# Patient Record
Sex: Male | Born: 1960
Health system: Southern US, Community
[De-identification: ages and names within clinical notes are randomized; demographics above are authoritative.]

## PROBLEM LIST (undated history)

## (undated) DIAGNOSIS — I10 Essential (primary) hypertension: Secondary | ICD-10-CM

## (undated) DIAGNOSIS — F909 Attention-deficit hyperactivity disorder, unspecified type: Secondary | ICD-10-CM

## (undated) DIAGNOSIS — E039 Hypothyroidism, unspecified: Secondary | ICD-10-CM

## (undated) DIAGNOSIS — E785 Hyperlipidemia, unspecified: Secondary | ICD-10-CM

## (undated) HISTORY — PX: WISDOM TOOTH EXTRACTION: SHX21

## (undated) HISTORY — DX: Hyperlipidemia, unspecified: E78.5

## (undated) HISTORY — DX: Attention-deficit hyperactivity disorder, unspecified type: F90.9

## (undated) HISTORY — DX: Hypothyroidism, unspecified: E03.9

## (undated) HISTORY — PX: PRE-MALIGNANT / BENIGN SKIN LESION EXCISION: SHX160

## (undated) HISTORY — DX: Essential (primary) hypertension: I10

---

## 2005-09-22 ENCOUNTER — Ambulatory Visit: Payer: Self-pay | Admitting: Family Medicine

## 2008-02-13 ENCOUNTER — Ambulatory Visit: Payer: Self-pay | Admitting: Family Medicine

## 2008-02-13 LAB — CONVERTED CEMR LAB
AST: 25 units/L (ref 0–37)
Basophils Relative: 0.2 % (ref 0.0–1.0)
Bilirubin, Direct: 0.2 mg/dL (ref 0.0–0.3)
CO2: 31 meq/L (ref 19–32)
Chloride: 102 meq/L (ref 96–112)
Eosinophils Absolute: 0.2 10*3/uL (ref 0.0–0.6)
Eosinophils Relative: 3.9 % (ref 0.0–5.0)
GFR calc non Af Amer: 77 mL/min
Glucose, Bld: 100 mg/dL — ABNORMAL HIGH (ref 70–99)
HCT: 42.8 % (ref 39.0–52.0)
Lymphocytes Relative: 34.4 % (ref 12.0–46.0)
MCV: 85.4 fL (ref 78.0–100.0)
Neutrophils Relative %: 50.7 % (ref 43.0–77.0)
Nitrite: NEGATIVE
Protein, U semiquant: NEGATIVE
RBC: 5.01 M/uL (ref 4.22–5.81)
Sodium: 140 meq/L (ref 135–145)
Specific Gravity, Urine: 1.01
TSH: 7.21 microintl units/mL — ABNORMAL HIGH (ref 0.35–5.50)
Total Bilirubin: 1.3 mg/dL — ABNORMAL HIGH (ref 0.3–1.2)
Total Protein: 7.2 g/dL (ref 6.0–8.3)
WBC: 6 10*3/uL (ref 4.5–10.5)

## 2008-03-17 ENCOUNTER — Ambulatory Visit: Payer: Self-pay | Admitting: Family Medicine

## 2008-03-17 DIAGNOSIS — E039 Hypothyroidism, unspecified: Secondary | ICD-10-CM | POA: Insufficient documentation

## 2008-03-17 DIAGNOSIS — I10 Essential (primary) hypertension: Secondary | ICD-10-CM | POA: Insufficient documentation

## 2008-03-17 LAB — CONVERTED CEMR LAB: TSH: 7.89 microintl units/mL — ABNORMAL HIGH (ref 0.35–5.50)

## 2008-03-19 ENCOUNTER — Telehealth: Payer: Self-pay | Admitting: Family Medicine

## 2008-06-30 ENCOUNTER — Encounter: Payer: Self-pay | Admitting: Family Medicine

## 2008-12-04 HISTORY — PX: COLONOSCOPY: SHX174

## 2009-07-22 ENCOUNTER — Telehealth: Payer: Self-pay | Admitting: Family Medicine

## 2009-07-30 ENCOUNTER — Ambulatory Visit: Payer: Self-pay | Admitting: Family Medicine

## 2009-08-25 ENCOUNTER — Ambulatory Visit: Payer: Self-pay | Admitting: Family Medicine

## 2009-08-25 LAB — CONVERTED CEMR LAB
Blood in Urine, dipstick: NEGATIVE
Nitrite: NEGATIVE
Protein, U semiquant: NEGATIVE
Urobilinogen, UA: 0.2
WBC Urine, dipstick: NEGATIVE

## 2009-08-27 ENCOUNTER — Telehealth: Payer: Self-pay | Admitting: Family Medicine

## 2009-08-27 LAB — CONVERTED CEMR LAB
ALT: 33 units/L (ref 0–53)
AST: 24 units/L (ref 0–37)
Basophils Relative: 0.2 % (ref 0.0–3.0)
Bilirubin, Direct: 0.1 mg/dL (ref 0.0–0.3)
Calcium: 9.7 mg/dL (ref 8.4–10.5)
Direct LDL: 134.9 mg/dL
Eosinophils Relative: 3.5 % (ref 0.0–5.0)
HCT: 44.1 % (ref 39.0–52.0)
HDL: 44.2 mg/dL (ref >39.00)
Lymphs Abs: 1.9 10*3/uL (ref 0.7–4.0)
MCV: 88.2 fL (ref 78.0–100.0)
Monocytes Absolute: 0.7 10*3/uL (ref 0.1–1.0)
Monocytes Relative: 10.6 % (ref 3.0–12.0)
Potassium: 5.2 meq/L — ABNORMAL HIGH (ref 3.5–5.1)
RBC: 5 M/uL (ref 4.22–5.81)
Sodium: 140 meq/L (ref 135–145)
TSH: 5.61 microintl units/mL — ABNORMAL HIGH (ref 0.35–5.50)
Total Bilirubin: 1.8 mg/dL — ABNORMAL HIGH (ref 0.3–1.2)
Total CHOL/HDL Ratio: 5
Total Protein: 7.6 g/dL (ref 6.0–8.3)
Triglycerides: 134 mg/dL (ref 0.0–149.0)
VLDL: 26.8 mg/dL (ref 0.0–40.0)
WBC: 6.8 10*3/uL (ref 4.5–10.5)

## 2009-09-27 ENCOUNTER — Ambulatory Visit: Payer: Self-pay | Admitting: Internal Medicine

## 2009-09-27 DIAGNOSIS — K625 Hemorrhage of anus and rectum: Secondary | ICD-10-CM | POA: Insufficient documentation

## 2009-09-27 DIAGNOSIS — K219 Gastro-esophageal reflux disease without esophagitis: Secondary | ICD-10-CM | POA: Insufficient documentation

## 2009-10-07 ENCOUNTER — Ambulatory Visit: Payer: Self-pay | Admitting: Internal Medicine

## 2009-10-07 LAB — HM COLONOSCOPY

## 2011-03-31 ENCOUNTER — Other Ambulatory Visit: Payer: Self-pay | Admitting: Family Medicine

## 2011-03-31 NOTE — Telephone Encounter (Signed)
Time for an office visit 

## 2011-05-03 ENCOUNTER — Other Ambulatory Visit: Payer: Self-pay | Admitting: *Deleted

## 2011-05-03 MED ORDER — LEVOTHYROXINE SODIUM 75 MCG PO TABS: 75.0000 ug | ORAL_TABLET | Freq: Every day | ORAL | Status: DC

## 2011-05-03 MED ORDER — LISINOPRIL 40 MG PO TABS: 40.0000 mg | ORAL_TABLET | Freq: Every day | ORAL | Status: DC

## 2011-05-03 NOTE — Telephone Encounter (Signed)
Pt has appt scheduled to see Dr. Tawanna Cooler on 05/08/11, however will need refill of synthroid 75 mcg and lisinopril 40 mg sent to [facility] until he can be seen on Monday.

## 2011-05-05 ENCOUNTER — Encounter: Payer: Self-pay | Admitting: Family Medicine

## 2011-05-08 ENCOUNTER — Encounter: Payer: Self-pay | Admitting: Family Medicine

## 2011-05-08 ENCOUNTER — Ambulatory Visit (INDEPENDENT_AMBULATORY_CARE_PROVIDER_SITE_OTHER): Payer: BC Managed Care – PPO | Admitting: Family Medicine

## 2011-05-08 DIAGNOSIS — R5383 Other fatigue: Secondary | ICD-10-CM

## 2011-05-08 DIAGNOSIS — I10 Essential (primary) hypertension: Secondary | ICD-10-CM

## 2011-05-08 DIAGNOSIS — E039 Hypothyroidism, unspecified: Secondary | ICD-10-CM

## 2011-05-08 DIAGNOSIS — R5381 Other malaise: Secondary | ICD-10-CM

## 2011-05-08 LAB — HEPATIC FUNCTION PANEL
Alkaline Phosphatase: 58 U/L (ref 39–117)
Bilirubin, Direct: 0.1 mg/dL (ref 0.0–0.3)
Total Bilirubin: 1 mg/dL (ref 0.3–1.2)

## 2011-05-08 LAB — BASIC METABOLIC PANEL
Calcium: 9.3 mg/dL (ref 8.4–10.5)
Creatinine, Ser: 1.1 mg/dL (ref 0.4–1.5)
GFR: 76.36 mL/min (ref >60.00)
Sodium: 135 mEq/L (ref 135–145)

## 2011-05-08 LAB — POCT URINALYSIS DIPSTICK
Bilirubin, UA: NEGATIVE
Blood, UA: NEGATIVE
Leukocytes, UA: NEGATIVE
Nitrite, UA: NEGATIVE
Protein, UA: NEGATIVE
pH, UA: 5.5

## 2011-05-08 LAB — CBC WITH DIFFERENTIAL/PLATELET
Basophils Absolute: 0 10*3/uL (ref 0.0–0.1)
Eosinophils Absolute: 0.1 10*3/uL (ref 0.0–0.7)
HCT: 41.4 % (ref 39.0–52.0)
Lymphs Abs: 1.7 10*3/uL (ref 0.7–4.0)
MCHC: 34.5 g/dL (ref 30.0–36.0)
Monocytes Absolute: 0.6 10*3/uL (ref 0.1–1.0)
Monocytes Relative: 8.5 % (ref 3.0–12.0)
Neutro Abs: 5 10*3/uL (ref 1.4–7.7)
Platelets: 228 10*3/uL (ref 150.0–400.0)
RDW: 12.8 % (ref 11.5–14.6)

## 2011-05-08 LAB — LIPID PANEL
HDL: 49.8 mg/dL (ref >39.00)
Total CHOL/HDL Ratio: 3
Triglycerides: 101 mg/dL (ref 0.0–149.0)
VLDL: 20.2 mg/dL (ref 0.0–40.0)

## 2011-05-08 MED ORDER — LISINOPRIL 40 MG PO TABS: 40.0000 mg | ORAL_TABLET | Freq: Every day | ORAL | Status: DC

## 2011-05-08 MED ORDER — LEVOTHYROXINE SODIUM 75 MCG PO TABS: 75.0000 ug | ORAL_TABLET | Freq: Every day | ORAL | Status: DC

## 2011-05-08 NOTE — Patient Instructions (Addendum)
Decrease the lisinopril to 30 mg daily.  Check y  blood pressure daily in the morning and if after one month ur  still having symptoms of lightheadedness when you stand up.,,,,,,,,,,,,,,,,,,,,,  Decrease the dose to 20 mg daily.  Continue thyroid medication, and aspirin.  Follow-up in one year annual physical sooner if any problems  I will also get an appointment to see Dr. Mellody Dance for evaluation of possible sleep apnea

## 2011-05-08 NOTE — Progress Notes (Signed)
  Subjective:    Patient ID: William Rosales, male    DOB: 23-Jul-1961, 50 y.o.   MRN: [id number]  HPI          William Rosales is a 50 year old, married male, nonsmoker, who comes in today for general physical examination because of a history of hypertension and hypothyroidism.  Four hypertension.  He takes lisinopril 40 mg daily.  He, states he's lightheaded when he gets up suddenly.  He also now has started going back to the gym and is exercising on a regular basis.  We discussed a methodology of lowering his medication.  Monthly.  He also has a history of hypothyroidism on Synthroid 75 mcg daily.  Will check TSH level today.  Tetanus 2010 annual eye exam by ophthalmologist.  He has fatigue, even though he sleeps for long periods of time.  Question sleep apnea    Review of Systems  Constitutional: Positive for fatigue.  HENT: Negative.   Eyes: Negative.   Respiratory: Negative.   Cardiovascular: Negative.   Gastrointestinal: Negative.   Genitourinary: Negative.   Musculoskeletal: Negative.   Skin: Negative.   Neurological: Negative.   Hematological: Negative.   Psychiatric/Behavioral: Negative.        Objective:   Physical Exam  Constitutional: He is oriented to person, place, and time. He appears well-developed and well-nourished.  HENT:  Head: Normocephalic and atraumatic.  Right Ear: External ear normal.  Left Ear: External ear normal.  Nose: Nose normal.  Mouth/Throat: Oropharynx is clear and moist.  Eyes: Conjunctivae and EOM are normal. Pupils are equal, round, and reactive to light.  Neck: Normal range of motion. Neck supple. No JVD present. No tracheal deviation present. No thyromegaly present.  Cardiovascular: Normal rate, regular rhythm, normal heart sounds and intact distal pulses.  Exam reveals no gallop and no friction rub.   No murmur heard. Pulmonary/Chest: Effort normal and breath sounds normal. No stridor. No respiratory distress. He has no wheezes. He has  no rales. He exhibits no tenderness.  Abdominal: Soft. Bowel sounds are normal. He exhibits no distension and no mass. There is no tenderness. There is no rebound and no guarding.  Genitourinary: Rectum normal, prostate normal and penis normal. Guaiac negative stool. No penile tenderness.  Musculoskeletal: Normal range of motion. He exhibits no edema and no tenderness.  Lymphadenopathy:    He has no cervical adenopathy.  Neurological: He is alert and oriented to person, place, and time. He has normal reflexes. No cranial nerve deficit. He exhibits normal muscle tone.  Skin: Skin is warm and dry. No rash noted. No erythema. No pallor.  Psychiatric: He has a normal mood and affect. His behavior is normal. Judgment and thought content normal.          Assessment & Plan:  Hypertension decrease lisinopril to 30 daily because of symptoms of orthostatic hypotension.  Continue Synthroid 75 mcg daily, and a baby aspirin.  Sleep study of [facility] and by Dr. Mellody Dance

## 2011-05-10 NOTE — Progress Notes (Signed)
Pt aware and copy ready for pick up

## 2011-05-31 ENCOUNTER — Ambulatory Visit (INDEPENDENT_AMBULATORY_CARE_PROVIDER_SITE_OTHER): Payer: BC Managed Care – PPO | Admitting: Pulmonary Disease

## 2011-05-31 ENCOUNTER — Encounter: Payer: Self-pay | Admitting: Pulmonary Disease

## 2011-05-31 VITALS — BP 112/80 | HR 63 | Temp 97.8°F | Ht 71.0 in | Wt 245.6 lb

## 2011-05-31 DIAGNOSIS — G4730 Sleep apnea, unspecified: Secondary | ICD-10-CM

## 2011-05-31 DIAGNOSIS — G4710 Hypersomnia, unspecified: Secondary | ICD-10-CM

## 2011-05-31 NOTE — Patient Instructions (Signed)
Will set up for home sleep testing.  Will call once results available Work on weight loss.

## 2011-05-31 NOTE — Assessment & Plan Note (Signed)
The pt's history is very suggestive of some degree of sleep disordered breathing.  He has been noted to have loud snoring, abnl breathing pattern, nonrestorative sleep, and EDS at times.  I have had a long discussion with the pt about sleep apnea, including its impact on QOL and CV health.  I think there is enough suspicion to warrant a sleep study, and the pt agrees (his wife is worried about this).

## 2011-05-31 NOTE — Progress Notes (Signed)
  Subjective:    Patient ID: William Rosales, male    DOB: Oct 08, 1961, 50 y.o.   MRN: [id number]  HPI The pt is a 50y/o male who I have been asked to see for possible osa.  His history is significant for: -loud snoring during sleep with abnormal breathing pattern -nonrestorative sleep -definite sleep pressure during the day with periods of inactivity, and occasionally catnaps at his desk -Can doze with routine tv in evenings, and some sleep pressure with driving longer distances. -weight down from 2 yrs ago, primarily in the last 5mos.  -epworth score 9.  Sleep Questionnaire: What time do you typically go to bed?( Between what hours) 10pm How long does it take you to fall asleep? less than How many times during the night do you wake up? 1 What time do you get out of bed to start your day? 0500 Do you drive or operate heavy machinery in your occupation? Yes How much has your weight changed (up or down) over the past two years? (In pounds) 25 lb (11.34 kg) Have you ever had a sleep study before? No Do you currently use CPAP? No Do you wear oxygen at any time? No    Review of Systems  Constitutional: Negative for fever and unexpected weight change.  HENT: Negative for ear pain, nosebleeds, congestion, sore throat, rhinorrhea, sneezing, trouble swallowing, dental problem, postnasal drip and sinus pressure.   Eyes: Negative for redness and itching.  Respiratory: Positive for shortness of breath. Negative for cough, chest tightness and wheezing.   Cardiovascular: Negative for palpitations and leg swelling.  Gastrointestinal: Negative for nausea and vomiting.  Genitourinary: Negative for dysuria.  Musculoskeletal: Negative for joint swelling.  Skin: Negative for rash.  Neurological: Negative for headaches.  Hematological: Does not bruise/bleed easily.  Psychiatric/Behavioral: Negative for dysphoric mood. The patient is not nervous/anxious.        Objective:   Physical  Exam Constitutional:  obese, no acute distress  HENT:  Nares patent without discharge, mild turbinate hypertrophy and mild septal deviation.   Oropharynx without exudate, palate and uvula are moderately elongated.  Eyes:  Perrla, eomi, no scleral icterus  Neck:  No JVD, no TMG  Cardiovascular:  Normal rate, regular rhythm, no rubs or gallops.  No murmurs        Intact distal pulses  Pulmonary :  Normal breath sounds, no stridor or respiratory distress   No rales, rhonchi, or wheezing  Abdominal:  Soft, nondistended, bowel sounds present.  No tenderness noted.   Musculoskeletal:  No lower extremity edema noted.  Lymph Nodes:  No cervical lymphadenopathy noted  Skin:  No cyanosis noted  Neurologic:  Alert, appropriate, moves all 4 extremities without obvious deficit.         Assessment & Plan:

## 2011-06-02 ENCOUNTER — Telehealth: Payer: Self-pay | Admitting: Pulmonary Disease

## 2011-06-02 NOTE — Telephone Encounter (Signed)
Please let pt know that I think he is an excellent candidate for home sleep testing, but his insurance company will only allow the formal sleep study at lab.  If he wishes to pursue a workup for this, will need study at sleep center.

## 2011-06-06 NOTE — Telephone Encounter (Signed)
LMOAM for patient to return my call in reference to home sleep study.

## 2011-06-12 NOTE — Telephone Encounter (Signed)
Spoke with patient today and advised patient that [employer] denied home sleep study. Patient stated that he wanted to call [employer] and see what his benefits on a formal study would be. Gave patient procedure code and patient will return my call and let me know what he has decided.

## 2011-06-13 NOTE — Telephone Encounter (Signed)
Pt to return my call if he wishes to schedule formal sleep study.

## 2012-05-18 ENCOUNTER — Other Ambulatory Visit: Payer: Self-pay | Admitting: Family Medicine

## 2013-03-07 ENCOUNTER — Other Ambulatory Visit: Payer: Self-pay | Admitting: Family Medicine

## 2013-04-23 ENCOUNTER — Other Ambulatory Visit: Payer: Self-pay | Admitting: Family Medicine

## 2013-05-30 ENCOUNTER — Other Ambulatory Visit: Payer: Self-pay | Admitting: Family Medicine

## 2013-05-30 NOTE — Telephone Encounter (Signed)
Pt has cpx sch for 07-29-13 and labs 07-22-13. Pt is out of synthroid med

## 2013-06-30 ENCOUNTER — Other Ambulatory Visit: Payer: Self-pay | Admitting: Family Medicine

## 2013-07-22 ENCOUNTER — Other Ambulatory Visit: Payer: BC Managed Care – PPO

## 2013-07-29 ENCOUNTER — Encounter: Payer: BC Managed Care – PPO | Admitting: Family Medicine

## 2013-08-07 ENCOUNTER — Other Ambulatory Visit: Payer: Self-pay | Admitting: Family Medicine

## 2013-09-09 ENCOUNTER — Other Ambulatory Visit (INDEPENDENT_AMBULATORY_CARE_PROVIDER_SITE_OTHER): Payer: BC Managed Care – PPO

## 2013-09-09 DIAGNOSIS — Z0000 Encounter for general adult medical examination without abnormal findings: Secondary | ICD-10-CM

## 2013-09-09 LAB — LIPID PANEL
HDL: 53.5 mg/dL (ref >39.00)
Total CHOL/HDL Ratio: 4
VLDL: 12.6 mg/dL (ref 0.0–40.0)

## 2013-09-09 LAB — CBC WITH DIFFERENTIAL/PLATELET
Basophils Relative: 0.3 % (ref 0.0–3.0)
Eosinophils Relative: 2.7 % (ref 0.0–5.0)
HCT: 42.5 % (ref 39.0–52.0)
Lymphs Abs: 2 10*3/uL (ref 0.7–4.0)
MCV: 85.7 fl (ref 78.0–100.0)
Monocytes Absolute: 0.6 10*3/uL (ref 0.1–1.0)
Monocytes Relative: 10.1 % (ref 3.0–12.0)
Platelets: 199 10*3/uL (ref 150.0–400.0)
RBC: 4.96 Mil/uL (ref 4.22–5.81)
WBC: 5.9 10*3/uL (ref 4.5–10.5)

## 2013-09-09 LAB — BASIC METABOLIC PANEL
BUN: 23 mg/dL (ref 6–23)
CO2: 29 mEq/L (ref 19–32)
Calcium: 9.3 mg/dL (ref 8.4–10.5)
Glucose, Bld: 106 mg/dL — ABNORMAL HIGH (ref 70–99)
Potassium: 4.8 mEq/L (ref 3.5–5.1)
Sodium: 140 mEq/L (ref 135–145)

## 2013-09-09 LAB — POCT URINALYSIS DIPSTICK
Leukocytes, UA: NEGATIVE
Nitrite, UA: NEGATIVE
Protein, UA: NEGATIVE
pH, UA: 5

## 2013-09-09 LAB — HEPATIC FUNCTION PANEL
ALT: 23 U/L (ref 0–53)
Albumin: 4.1 g/dL (ref 3.5–5.2)
Total Protein: 6.7 g/dL (ref 6.0–8.3)

## 2013-09-09 LAB — PSA: PSA: 0.8 ng/mL (ref 0.10–4.00)

## 2013-09-12 ENCOUNTER — Other Ambulatory Visit: Payer: Self-pay | Admitting: Family Medicine

## 2013-09-16 ENCOUNTER — Encounter: Payer: Self-pay | Admitting: Family Medicine

## 2013-09-16 ENCOUNTER — Ambulatory Visit (INDEPENDENT_AMBULATORY_CARE_PROVIDER_SITE_OTHER): Payer: BC Managed Care – PPO | Admitting: Family Medicine

## 2013-09-16 VITALS — BP 130/80 | Temp 98.6°F | Ht 71.0 in | Wt 229.0 lb

## 2013-09-16 DIAGNOSIS — I10 Essential (primary) hypertension: Secondary | ICD-10-CM

## 2013-09-16 DIAGNOSIS — M722 Plantar fascial fibromatosis: Secondary | ICD-10-CM

## 2013-09-16 DIAGNOSIS — E039 Hypothyroidism, unspecified: Secondary | ICD-10-CM

## 2013-09-16 DIAGNOSIS — Z23 Encounter for immunization: Secondary | ICD-10-CM

## 2013-09-16 MED ORDER — LEVOTHYROXINE SODIUM 75 MCG PO TABS: ORAL_TABLET | ORAL | Status: DC

## 2013-09-16 MED ORDER — LISINOPRIL 40 MG PO TABS: ORAL_TABLET | ORAL | Status: DC

## 2013-09-16 NOTE — Patient Instructions (Signed)
For the plantar fasciitis we will get you a consult with Jeanene Erb physical therapist  For the soreness in your left breast splint, ice, Motrin 600 mg twice daily  Continue your other medication  Return in one year for general physical examination sooner if any problem

## 2013-09-16 NOTE — Progress Notes (Signed)
  Subjective:    Patient ID: William Rosales, male    DOB: 1961-07-21, 52 y.o.   MRN: [id number]  HPI William Rosales is a 52 year old married male nonsmoker who comes in today for a physical examination because of a history of hypothyroidism and hypertension  He takes is Synthroid 75 mcg daily TSH normal 3. A5.  He takes lisinopril 40 mg daily BP here 130/80. He says at home it runs around 120/60-70 diastolic and occasionally feels lightheaded when he stands up. We discussed decreasing his medication however stop bothering him that much and he elects to continue his current dose  He gets routine eye care, dental care, colonoscopy couple years ago because of a family history of colon cancer was negative. Tetanus 2010 seasonal flu shot today  Review of systems otherwise negative except for some soreness in his left wrist and his left heel he does go to the gym very frequently   Review of Systems  Constitutional: Negative.   HENT: Negative.   Eyes: Negative.   Respiratory: Negative.   Cardiovascular: Negative.   Gastrointestinal: Negative.   Genitourinary: Negative.   Musculoskeletal: Negative.   Skin: Negative.   Neurological: Negative.   Psychiatric/Behavioral: Negative.        Objective:   Physical Exam  Nursing note and vitals reviewed. Constitutional: He is oriented to person, place, and time. He appears well-developed and well-nourished.  HENT:  Head: Normocephalic and atraumatic.  Right Ear: External ear normal.  Left Ear: External ear normal.  Nose: Nose normal.  Mouth/Throat: Oropharynx is clear and moist.  Eyes: Conjunctivae and EOM are normal. Pupils are equal, round, and reactive to light.  Neck: Normal range of motion. Neck supple. No JVD present. No tracheal deviation present. No thyromegaly present.  Cardiovascular: Normal rate, regular rhythm, normal heart sounds and intact distal pulses.  Exam reveals no gallop and no friction rub.   No murmur  heard. Pulmonary/Chest: Effort normal and breath sounds normal. No stridor. No respiratory distress. He has no wheezes. He has no rales. He exhibits no tenderness.  Abdominal: Soft. Bowel sounds are normal. He exhibits no distension and no mass. There is no tenderness. There is no rebound and no guarding.  Genitourinary: Rectum normal, prostate normal and penis normal. Guaiac negative stool. No penile tenderness.  Musculoskeletal: Normal range of motion. He exhibits no edema and no tenderness.  He has some tenderness in the medial portion of his left wrist consistent with some tendinitis  He also has pain on the plantar fascia consistent with plantar fasciitis  Lymphadenopathy:    He has no cervical adenopathy.  Neurological: He is alert and oriented to person, place, and time. He has normal reflexes. No cranial nerve deficit. He exhibits normal muscle tone.  Skin: Skin is warm and dry. No rash noted. No erythema. No pallor.  Psychiatric: He has a normal mood and affect. His behavior is normal. Judgment and thought content normal.          Assessment & Plan:  Healthy male  Hypothyroidism continue Synthroid supplement to 75 mcg daily  Hypertension continue lisinopril 40 mg daily  Tendinitis left wrist splint and Motrin 600 twice a day elevation and ice  Plantar fasciitis consult with Jeanene Erb since this is been a persistent problem over a two-year period time and he hasn't found much relief

## 2013-10-17 ENCOUNTER — Other Ambulatory Visit: Payer: Self-pay | Admitting: Family Medicine

## 2014-03-05 ENCOUNTER — Other Ambulatory Visit: Payer: Self-pay | Admitting: Family Medicine

## 2014-12-02 ENCOUNTER — Other Ambulatory Visit: Payer: Self-pay | Admitting: Family Medicine

## 2015-02-27 ENCOUNTER — Other Ambulatory Visit: Payer: Self-pay | Admitting: Family Medicine

## 2015-04-08 ENCOUNTER — Other Ambulatory Visit (INDEPENDENT_AMBULATORY_CARE_PROVIDER_SITE_OTHER): Payer: BLUE CROSS/BLUE SHIELD

## 2015-04-08 DIAGNOSIS — Z0000 Encounter for general adult medical examination without abnormal findings: Secondary | ICD-10-CM | POA: Diagnosis not present

## 2015-04-08 LAB — CBC WITH DIFFERENTIAL/PLATELET
BASOS ABS: 0 10*3/uL (ref 0.0–0.1)
Basophils Relative: 0.4 % (ref 0.0–3.0)
EOS ABS: 0.1 10*3/uL (ref 0.0–0.7)
Eosinophils Relative: 2.3 % (ref 0.0–5.0)
HCT: 42.7 % (ref 39.0–52.0)
Hemoglobin: 14.7 g/dL (ref 13.0–17.0)
LYMPHS PCT: 35.7 % (ref 12.0–46.0)
Lymphs Abs: 2 10*3/uL (ref 0.7–4.0)
MCHC: 34.3 g/dL (ref 30.0–36.0)
MCV: 86.1 fl (ref 78.0–100.0)
MONOS PCT: 9.6 % (ref 3.0–12.0)
Monocytes Absolute: 0.5 10*3/uL (ref 0.1–1.0)
NEUTROS PCT: 52 % (ref 43.0–77.0)
Neutro Abs: 3 10*3/uL (ref 1.4–7.7)
Platelets: 213 10*3/uL (ref 150.0–400.0)
RBC: 4.96 Mil/uL (ref 4.22–5.81)
RDW: 12.8 % (ref 11.5–15.5)
WBC: 5.7 10*3/uL (ref 4.0–10.5)

## 2015-04-08 LAB — HEPATIC FUNCTION PANEL
ALBUMIN: 4.2 g/dL (ref 3.5–5.2)
ALK PHOS: 61 U/L (ref 39–117)
ALT: 19 U/L (ref 0–53)
AST: 17 U/L (ref 0–37)
BILIRUBIN TOTAL: 1.3 mg/dL — AB (ref 0.2–1.2)
Bilirubin, Direct: 0.3 mg/dL (ref 0.0–0.3)
TOTAL PROTEIN: 6.9 g/dL (ref 6.0–8.3)

## 2015-04-08 LAB — LIPID PANEL
Cholesterol: 183 mg/dL (ref 0–200)
HDL: 57.4 mg/dL (ref >39.00)
LDL Cholesterol: 111 mg/dL — ABNORMAL HIGH (ref 0–99)
NonHDL: 125.6
Total CHOL/HDL Ratio: 3
Triglycerides: 74 mg/dL (ref 0.0–149.0)
VLDL: 14.8 mg/dL (ref 0.0–40.0)

## 2015-04-08 LAB — PSA: PSA: 0.9 ng/mL (ref 0.10–4.00)

## 2015-04-08 LAB — BASIC METABOLIC PANEL
BUN: 19 mg/dL (ref 6–23)
CALCIUM: 9.6 mg/dL (ref 8.4–10.5)
CO2: 28 mEq/L (ref 19–32)
CREATININE: 1.04 mg/dL (ref 0.40–1.50)
Chloride: 103 mEq/L (ref 96–112)
GFR: 79.37 mL/min (ref >60.00)
Glucose, Bld: 119 mg/dL — ABNORMAL HIGH (ref 70–99)
Potassium: 4.8 mEq/L (ref 3.5–5.1)
Sodium: 137 mEq/L (ref 135–145)

## 2015-04-08 LAB — POCT URINALYSIS DIPSTICK
BILIRUBIN UA: NEGATIVE
Blood, UA: NEGATIVE
GLUCOSE UA: NEGATIVE
Ketones, UA: NEGATIVE
Leukocytes, UA: NEGATIVE
NITRITE UA: NEGATIVE
PH UA: 5.5
Protein, UA: NEGATIVE
Spec Grav, UA: 1.02
Urobilinogen, UA: 0.2

## 2015-04-08 LAB — TSH: TSH: 3.99 u[IU]/mL (ref 0.35–4.50)

## 2015-04-12 ENCOUNTER — Encounter: Payer: Self-pay | Admitting: Family Medicine

## 2015-04-13 ENCOUNTER — Encounter: Payer: Self-pay | Admitting: Family Medicine

## 2015-04-13 ENCOUNTER — Ambulatory Visit (INDEPENDENT_AMBULATORY_CARE_PROVIDER_SITE_OTHER): Payer: BLUE CROSS/BLUE SHIELD | Admitting: Family Medicine

## 2015-04-13 VITALS — BP 136/84 | HR 61 | Temp 98.3°F | Ht 70.5 in | Wt 240.0 lb

## 2015-04-13 DIAGNOSIS — E039 Hypothyroidism, unspecified: Secondary | ICD-10-CM | POA: Diagnosis not present

## 2015-04-13 DIAGNOSIS — I10 Essential (primary) hypertension: Secondary | ICD-10-CM | POA: Diagnosis not present

## 2015-04-13 DIAGNOSIS — R7309 Other abnormal glucose: Secondary | ICD-10-CM | POA: Diagnosis not present

## 2015-04-13 DIAGNOSIS — R739 Hyperglycemia, unspecified: Secondary | ICD-10-CM

## 2015-04-13 DIAGNOSIS — Z0000 Encounter for general adult medical examination without abnormal findings: Secondary | ICD-10-CM | POA: Diagnosis not present

## 2015-04-13 MED ORDER — LISINOPRIL 40 MG PO TABS: 40.0000 mg | ORAL_TABLET | Freq: Every day | ORAL | Status: DC

## 2015-04-13 MED ORDER — LEVOTHYROXINE SODIUM 75 MCG PO TABS: 75.0000 ug | ORAL_TABLET | Freq: Every day | ORAL | Status: DC

## 2015-04-13 NOTE — Progress Notes (Signed)
   Subjective:    Patient ID: William EinsteinRichard Paul Struthers, male    DOB: 1961-02-13, 54 y.o.   MRN: [id number]  HPI [name] is a 54 year old married male nonsmoker who comes in today for general physical examination because of a history of hypertension and hypothyroidism  He currently takes lisinopril 40 mg daily BP 136/84  He takes Synthroid 75 g daily for because of a history of hypothyroidism. TSH level normal  His weight is up from 229 last fall to 240 today. He is not following a diet nor is he exercising  Vaccinations up-to-date tetanus booster 2010  He gets routine eye care, dental care, colonoscopy 2010 was normal   Review of Systems  Constitutional: Negative.   HENT: Negative.   Eyes: Negative.   Respiratory: Negative.   Cardiovascular: Negative.   Gastrointestinal: Negative.   Endocrine: Negative.   Genitourinary: Negative.   Musculoskeletal: Negative.   Skin: Negative.   Allergic/Immunologic: Negative.   Neurological: Negative.   Hematological: Negative.   Psychiatric/Behavioral: Negative.        Objective:   Physical Exam  Constitutional: He is oriented to person, place, and time. He appears well-developed and well-nourished.  HENT:  Head: Normocephalic and atraumatic.  Right Ear: External ear normal.  Left Ear: External ear normal.  Nose: Nose normal.  Mouth/Throat: Oropharynx is clear and moist.  Eyes: Conjunctivae and EOM are normal. Pupils are equal, round, and reactive to light.  Neck: Normal range of motion. Neck supple. No JVD present. No tracheal deviation present. No thyromegaly present.  Cardiovascular: Normal rate, regular rhythm, normal heart sounds and intact distal pulses.  Exam reveals no gallop and no friction rub.   No murmur heard. Pulmonary/Chest: Effort normal and breath sounds normal. No stridor. No respiratory distress. He has no wheezes. He has no rales. He exhibits no tenderness.  Abdominal: Soft. Bowel sounds are normal. He exhibits no  distension and no mass. There is no tenderness. There is no rebound and no guarding.  Genitourinary: Rectum normal, prostate normal and penis normal. Guaiac negative stool. No penile tenderness.  Musculoskeletal: Normal range of motion. He exhibits no edema or tenderness.  Lymphadenopathy:    He has no cervical adenopathy.  Neurological: He is alert and oriented to person, place, and time. He has normal reflexes. No cranial nerve deficit. He exhibits normal muscle tone.  Skin: Skin is warm and dry. No rash noted. No erythema. No pallor.  Psychiatric: He has a normal mood and affect. His behavior is normal. Judgment and thought content normal.  Nursing note and vitals reviewed.         Assessment & Plan:  Healthy male  Hypertension ago continue current therapy  Hypothyroidism.....Marland Kitchen. continue current therapy  Overweight........... again discussed diet exercise and weight loss

## 2015-04-13 NOTE — Patient Instructions (Addendum)
Continue current medications  Start a diet and exercise program as we discussed  Return in one year for general physical examination sooner if any problem  Return in 6 months for follow-up  Fasting labs one week prior  You're developing the metabolic syndrome

## 2015-04-13 NOTE — Progress Notes (Signed)
Pre visit review using our clinic review tool, if applicable. No additional management support is needed unless otherwise documented below in the visit note. 

## 2015-04-15 ENCOUNTER — Encounter: Payer: Self-pay | Admitting: Family Medicine

## 2015-04-26 ENCOUNTER — Encounter: Payer: Self-pay | Admitting: Internal Medicine

## 2015-06-17 ENCOUNTER — Other Ambulatory Visit: Payer: Self-pay | Admitting: Family Medicine

## 2015-10-08 ENCOUNTER — Other Ambulatory Visit: Payer: Self-pay | Admitting: *Deleted

## 2015-10-08 DIAGNOSIS — R739 Hyperglycemia, unspecified: Secondary | ICD-10-CM

## 2015-10-11 ENCOUNTER — Other Ambulatory Visit: Payer: BLUE CROSS/BLUE SHIELD

## 2015-10-12 ENCOUNTER — Ambulatory Visit (INDEPENDENT_AMBULATORY_CARE_PROVIDER_SITE_OTHER): Payer: BLUE CROSS/BLUE SHIELD

## 2015-10-12 DIAGNOSIS — Z23 Encounter for immunization: Secondary | ICD-10-CM

## 2015-10-18 ENCOUNTER — Ambulatory Visit: Payer: BLUE CROSS/BLUE SHIELD | Admitting: Family Medicine

## 2015-12-14 ENCOUNTER — Other Ambulatory Visit: Payer: BLUE CROSS/BLUE SHIELD

## 2015-12-21 ENCOUNTER — Ambulatory Visit: Payer: BLUE CROSS/BLUE SHIELD | Admitting: Family Medicine

## 2016-02-08 ENCOUNTER — Other Ambulatory Visit (INDEPENDENT_AMBULATORY_CARE_PROVIDER_SITE_OTHER): Payer: BLUE CROSS/BLUE SHIELD

## 2016-02-08 DIAGNOSIS — R739 Hyperglycemia, unspecified: Secondary | ICD-10-CM | POA: Diagnosis not present

## 2016-02-08 LAB — BASIC METABOLIC PANEL
BUN: 19 mg/dL (ref 6–23)
CALCIUM: 9.6 mg/dL (ref 8.4–10.5)
CHLORIDE: 104 meq/L (ref 96–112)
CO2: 27 mEq/L (ref 19–32)
CREATININE: 0.99 mg/dL (ref 0.40–1.50)
GFR: 83.74 mL/min (ref >60.00)
Glucose, Bld: 114 mg/dL — ABNORMAL HIGH (ref 70–99)
Potassium: 4.6 mEq/L (ref 3.5–5.1)
Sodium: 140 mEq/L (ref 135–145)

## 2016-02-08 LAB — HEMOGLOBIN A1C: Hgb A1c MFr Bld: 5.6 % (ref 4.6–6.5)

## 2016-02-15 ENCOUNTER — Ambulatory Visit (INDEPENDENT_AMBULATORY_CARE_PROVIDER_SITE_OTHER): Payer: BLUE CROSS/BLUE SHIELD | Admitting: Family Medicine

## 2016-02-15 ENCOUNTER — Encounter: Payer: Self-pay | Admitting: Family Medicine

## 2016-02-15 VITALS — BP 130/90 | Temp 97.8°F | Wt 226.0 lb

## 2016-02-15 DIAGNOSIS — E663 Overweight: Secondary | ICD-10-CM | POA: Diagnosis not present

## 2016-02-15 DIAGNOSIS — R7309 Other abnormal glucose: Secondary | ICD-10-CM | POA: Diagnosis not present

## 2016-02-15 DIAGNOSIS — I10 Essential (primary) hypertension: Secondary | ICD-10-CM | POA: Diagnosis not present

## 2016-02-15 DIAGNOSIS — R739 Hyperglycemia, unspecified: Secondary | ICD-10-CM | POA: Insufficient documentation

## 2016-02-15 MED ORDER — LISINOPRIL 30 MG PO TABS
30.0000 mg | ORAL_TABLET | Freq: Every day | ORAL | Status: DC
Start: 2016-02-15 — End: 2017-01-31

## 2016-02-15 NOTE — Progress Notes (Signed)
   Subjective:    Patient ID: William Rosales, male    DOB: 1961-06-14, 55 y.o.   MRN: [id number]  HPI William Rosales is a 55 year old married male nonsmoker who comes in today for follow-up of elevated blood sugar  We saw him a while back for his checkup blood sugar is elevated he went on a diet and exercise program. He lost actually down to 220 pounds. He's back up to 226 cousin when on vacation.  His blood pressure and lisinopril 40 mg daily is 112/75 at home and is lightheaded when he stands up. Because he's lost weight he probably needs less blood pressure medication  A1c is below 6.5%   Review of Systems Review of systems negative    Objective:   Physical Exam Well-developed well-nourished male no acute distress vital signs stable he is afebrile BP 130/90       Assessment & Plan:  Elevated blood sugar.......... normalized A1c with diet exercise and weight loss........ continue that process  Hypertension.........Marland Kitchen. lightheaded when he stands up...Marland Kitchen.Marland Kitchen.Marland Kitchen. low blood pressures at home....... decrease lisinopril to 30 mg daily  Overweight.......Marland Kitchen. weight down to 226 continue the diet

## 2016-02-15 NOTE — Patient Instructions (Signed)
Decrease the lisinopril to 30 mg daily........... BP check daily for 4 weeks........Marland Kitchen. blood pressure goal 135/85 or less......... if you're still lightheaded and your blood pressure is still running low after a month of the 30 mg dose call.......Marland Kitchen. Rachel's extension is 2231  Continue diet exercise program  Follow-up the second week in May for general physical exam........ fasting labs one week prior

## 2016-02-15 NOTE — Progress Notes (Signed)
Pre visit review using our clinic review tool, if applicable. No additional management support is needed unless otherwise documented below in the visit note. 

## 2016-04-05 ENCOUNTER — Other Ambulatory Visit: Payer: BLUE CROSS/BLUE SHIELD

## 2016-04-06 ENCOUNTER — Other Ambulatory Visit (INDEPENDENT_AMBULATORY_CARE_PROVIDER_SITE_OTHER): Payer: BLUE CROSS/BLUE SHIELD

## 2016-04-06 DIAGNOSIS — I10 Essential (primary) hypertension: Secondary | ICD-10-CM | POA: Diagnosis not present

## 2016-04-06 DIAGNOSIS — R7309 Other abnormal glucose: Secondary | ICD-10-CM

## 2016-04-06 DIAGNOSIS — R739 Hyperglycemia, unspecified: Secondary | ICD-10-CM

## 2016-04-06 DIAGNOSIS — E663 Overweight: Secondary | ICD-10-CM | POA: Diagnosis not present

## 2016-04-06 LAB — CBC WITH DIFFERENTIAL/PLATELET
BASOS PCT: 0.3 % (ref 0.0–3.0)
Basophils Absolute: 0 10*3/uL (ref 0.0–0.1)
EOS ABS: 0.1 10*3/uL (ref 0.0–0.7)
Eosinophils Relative: 1 % (ref 0.0–5.0)
HEMATOCRIT: 44 % (ref 39.0–52.0)
Hemoglobin: 14.9 g/dL (ref 13.0–17.0)
LYMPHS ABS: 1.8 10*3/uL (ref 0.7–4.0)
LYMPHS PCT: 31 % (ref 12.0–46.0)
MCHC: 33.9 g/dL (ref 30.0–36.0)
MCV: 85.3 fl (ref 78.0–100.0)
Monocytes Absolute: 0.6 10*3/uL (ref 0.1–1.0)
Monocytes Relative: 10.5 % (ref 3.0–12.0)
NEUTROS ABS: 3.4 10*3/uL (ref 1.4–7.7)
NEUTROS PCT: 57.2 % (ref 43.0–77.0)
PLATELETS: 236 10*3/uL (ref 150.0–400.0)
RBC: 5.16 Mil/uL (ref 4.22–5.81)
RDW: 13.7 % (ref 11.5–15.5)
WBC: 5.9 10*3/uL (ref 4.0–10.5)

## 2016-04-06 LAB — POCT URINALYSIS DIPSTICK
Bilirubin, UA: NEGATIVE
Blood, UA: NEGATIVE
GLUCOSE UA: NEGATIVE
Ketones, UA: NEGATIVE
LEUKOCYTES UA: NEGATIVE
Nitrite, UA: NEGATIVE
Protein, UA: NEGATIVE
SPEC GRAV UA: 1.015
UROBILINOGEN UA: 0.2
pH, UA: 7

## 2016-04-06 LAB — LIPID PANEL
CHOL/HDL RATIO: 4
CHOLESTEROL: 199 mg/dL (ref 0–200)
HDL: 55.8 mg/dL (ref >39.00)
LDL Cholesterol: 127 mg/dL — ABNORMAL HIGH (ref 0–99)
NonHDL: 142.86
TRIGLYCERIDES: 79 mg/dL (ref 0.0–149.0)
VLDL: 15.8 mg/dL (ref 0.0–40.0)

## 2016-04-06 LAB — HEPATIC FUNCTION PANEL
ALK PHOS: 59 U/L (ref 39–117)
ALT: 16 U/L (ref 0–53)
AST: 17 U/L (ref 0–37)
Albumin: 4.7 g/dL (ref 3.5–5.2)
BILIRUBIN DIRECT: 0.2 mg/dL (ref 0.0–0.3)
TOTAL PROTEIN: 7.2 g/dL (ref 6.0–8.3)
Total Bilirubin: 1.3 mg/dL — ABNORMAL HIGH (ref 0.2–1.2)

## 2016-04-06 LAB — BASIC METABOLIC PANEL
BUN: 18 mg/dL (ref 6–23)
CALCIUM: 9.8 mg/dL (ref 8.4–10.5)
CO2: 29 meq/L (ref 19–32)
CREATININE: 1.03 mg/dL (ref 0.40–1.50)
Chloride: 101 mEq/L (ref 96–112)
GFR: 79.95 mL/min (ref >60.00)
GLUCOSE: 114 mg/dL — AB (ref 70–99)
Potassium: 4.3 mEq/L (ref 3.5–5.1)
Sodium: 137 mEq/L (ref 135–145)

## 2016-04-06 LAB — HEMOGLOBIN A1C: Hgb A1c MFr Bld: 5.5 % (ref 4.6–6.5)

## 2016-04-06 LAB — TSH: TSH: 4.08 u[IU]/mL (ref 0.35–4.50)

## 2016-04-06 LAB — PSA: PSA: 1.09 ng/mL (ref 0.10–4.00)

## 2016-04-12 ENCOUNTER — Ambulatory Visit (INDEPENDENT_AMBULATORY_CARE_PROVIDER_SITE_OTHER): Payer: BLUE CROSS/BLUE SHIELD | Admitting: Family Medicine

## 2016-04-12 ENCOUNTER — Encounter: Payer: Self-pay | Admitting: Family Medicine

## 2016-04-12 VITALS — BP 120/80 | Temp 98.2°F | Ht 71.0 in | Wt 222.0 lb

## 2016-04-12 DIAGNOSIS — E039 Hypothyroidism, unspecified: Secondary | ICD-10-CM | POA: Diagnosis not present

## 2016-04-12 DIAGNOSIS — I10 Essential (primary) hypertension: Secondary | ICD-10-CM

## 2016-04-12 DIAGNOSIS — Z8249 Family history of ischemic heart disease and other diseases of the circulatory system: Secondary | ICD-10-CM | POA: Insufficient documentation

## 2016-04-12 DIAGNOSIS — Z0000 Encounter for general adult medical examination without abnormal findings: Secondary | ICD-10-CM

## 2016-04-12 MED ORDER — LEVOTHYROXINE SODIUM 75 MCG PO TABS: ORAL_TABLET | ORAL | Status: DC

## 2016-04-12 MED ORDER — ATORVASTATIN CALCIUM 10 MG PO TABS: 10.0000 mg | ORAL_TABLET | Freq: Every day | ORAL | Status: DC

## 2016-04-12 NOTE — Progress Notes (Signed)
Pre visit review using our clinic review tool, if applicable. No additional management support is needed unless otherwise documented below in the visit note. 

## 2016-04-12 NOTE — Progress Notes (Signed)
   Subjective:    Patient ID: William Rosales, male    DOB: 09/22/1961, 55 y.o.   MRN: [id number]  HPI [name] is a 55 year old married male nonsmoker comes in today for general physical examination  He's always been in excellent health he said no chronic health problems except difficulty with his weight. He's tender to 22 with diet and exercise  He takes lisinopril 30 mg daily for blood pressure control BP 120/80  He takes an aspirin daily along with his thyroid 75 g daily. We recently decreased his lisinopril from 40 mg to 30 mg because his blood pressure dropped too low.  His major concern today is that his brother was recently diagnosed with coronary artery disease and had quadruple bypass. His brother 165 years of age. His only risk factor was hyperlipidemia. He also was a smoker.  Vaccinations up-to-date   Review of Systems  Constitutional: Negative.   HENT: Negative.   Eyes: Negative.   Respiratory: Negative.   Cardiovascular: Negative.   Gastrointestinal: Negative.   Endocrine: Negative.   Genitourinary: Negative.   Musculoskeletal: Negative.   Skin: Negative.   Allergic/Immunologic: Negative.   Neurological: Negative.   Hematological: Negative.   Psychiatric/Behavioral: Negative.        Objective:   Physical Exam  Constitutional: He is oriented to person, place, and time. He appears well-developed and well-nourished.  HENT:  Head: Normocephalic and atraumatic.  Right Ear: External ear normal.  Left Ear: External ear normal.  Nose: Nose normal.  Mouth/Throat: Oropharynx is clear and moist.  Eyes: Conjunctivae and EOM are normal. Pupils are equal, round, and reactive to light.  Neck: Normal range of motion. Neck supple. No JVD present. No tracheal deviation present. No thyromegaly present.  Cardiovascular: Normal rate, regular rhythm, normal heart sounds and intact distal pulses.  Exam reveals no gallop and no friction rub.   No murmur  heard. Pulmonary/Chest: Effort normal and breath sounds normal. No stridor. No respiratory distress. He has no wheezes. He has no rales. He exhibits no tenderness.  Abdominal: Soft. Bowel sounds are normal. He exhibits no distension and no mass. There is no tenderness. There is no rebound and no guarding.  Genitourinary: Rectum normal, prostate normal and penis normal. Guaiac negative stool. No penile tenderness.  Musculoskeletal: Normal range of motion. He exhibits no edema or tenderness.  Lymphadenopathy:    He has no cervical adenopathy.  Neurological: He is alert and oriented to person, place, and time. He has normal reflexes. No cranial nerve deficit. He exhibits normal muscle tone.  Skin: Skin is warm and dry. No rash noted. No erythema. No pallor.  Psychiatric: He has a normal mood and affect. His behavior is normal. Judgment and thought content normal.  Nursing note and vitals reviewed.         Assessment & Plan:  Hyperlipidemia.......... start Lipitor 10 mg daily at bedtime follow-up lipid panel in 8-12 weeks  Hypertension at goal,,,,,,, continue lisinopril 30 mg daily  Hypothyroidism,,,,,,,, continue Synthroid 75 g daily  Obesity,,,,,,,, BMI 31.96,,,,,,, continue diet exercise and weight loss  Family history coronary artery disease,,,,,,,,,,, patient would like a cardiac consult even though he is asymptomatic which I don't think 7 reasonable. We will also start him on a lipid-lowering drug to drive his LDL below 75,.

## 2016-04-12 NOTE — Patient Instructions (Signed)
Begin Lipitor 10 mg............. one daily at bedtime  Continue other medications  We will get you set up for cardiac consult

## 2016-04-22 ENCOUNTER — Other Ambulatory Visit: Payer: Self-pay | Admitting: Family Medicine

## 2016-05-04 ENCOUNTER — Encounter: Payer: Self-pay | Admitting: Internal Medicine

## 2016-05-04 ENCOUNTER — Ambulatory Visit (INDEPENDENT_AMBULATORY_CARE_PROVIDER_SITE_OTHER): Payer: BLUE CROSS/BLUE SHIELD | Admitting: Internal Medicine

## 2016-05-04 VITALS — BP 138/80 | HR 63 | Ht 71.0 in | Wt 230.0 lb

## 2016-05-04 DIAGNOSIS — I10 Essential (primary) hypertension: Secondary | ICD-10-CM | POA: Diagnosis not present

## 2016-05-04 DIAGNOSIS — E785 Hyperlipidemia, unspecified: Secondary | ICD-10-CM | POA: Diagnosis not present

## 2016-05-04 DIAGNOSIS — Z8249 Family history of ischemic heart disease and other diseases of the circulatory system: Secondary | ICD-10-CM

## 2016-05-04 NOTE — Patient Instructions (Signed)
Dr. Rennis GoldenHilty has ordered a coronary calcium score - this is done at [address] - 3rd floor  Your physician recommends that you schedule a follow-up appointment w/Dr. Rennis GoldenHilty after your test.

## 2016-05-04 NOTE — Progress Notes (Signed)
OFFICE NOTE  Chief Complaint:  "Brother just had quadruple bypass"  Primary Care Physician: Evette Georges, MD  HPI:  Bland Rudzinski Bergstresser is a 55 y.o. male  who is referred to me for evaluation of possible coronary disease. Mr. Life is generally asymptomatic although occasionally gets a cool sensation across his chest when sitting up from laying down. It is not described as a pain or palpitations. He does not feel that when he exercises or is active or lifts heavy objects. His concern is started recently with his brother who had quadruple bypass at age 82. Before that he did not have any family history of coronary disease which was known. His parents had some hypertension and coronary risk factors. He has hypertension and dyslipidemia and hypothyroidism. He is concerned about the possibility of him having coronary artery disease and wanted to "get checked out". He was recently started on Lipitor for dyslipidemia and an LDL of 127.  PMHx:  Past Medical History  Diagnosis Date  . ADD (attention deficit disorder with hyperactivity)   . Hypertension   . Hypothyroidism     Past Surgical History  Procedure Laterality Date  . Scalp lesion removal w/ flap and skin graft      reports it was benign    FAMHx:  Family History  Problem Relation Age of Onset  . Cancer Maternal Uncle   . Hypertension Other     SOCHx:   reports that he quit smoking about 20 years ago. His smoking use included Cigarettes. He has a 18 pack-year smoking history. He does not have any smokeless tobacco history on file. He reports that he does not drink alcohol or use illicit drugs.  ALLERGIES:  No Known Allergies  ROS: Pertinent items noted in HPI and remainder of comprehensive ROS otherwise negative.  HOME MEDS: Current Outpatient Prescriptions  Medication Sig Dispense Refill  . aspirin 81 MG tablet Take 81 mg by mouth daily.      Marland Kitchen atorvastatin (LIPITOR) 10 MG tablet Take 1 tablet (10 mg  total) by mouth daily. 90 tablet 3  . levothyroxine (SYNTHROID, LEVOTHROID) 75 MCG tablet TAKE 1 TABLET BY MOUTH EVERY DAY **NNEDS OV** 100 tablet 3  . lisinopril (PRINIVIL,ZESTRIL) 30 MG tablet Take 1 tablet (30 mg total) by mouth daily. 100 tablet 3   No current facility-administered medications for this visit.    LABS/IMAGING: No results found for this or any previous visit (from the past 48 hour(s)). No results found.  WEIGHTS: Wt Readings from Last 3 Encounters:  05/04/16 230 lb (104.327 kg)  04/12/16 222 lb (100.699 kg)  02/15/16 226 lb (102.513 kg)    VITALS: BP 138/80 mmHg  Pulse 63  Ht  (1.803 m)  Wt 230 lb (104.327 kg)  BMI 32.09 kg/m2  EXAM: General appearance: alert and no distress Neck: no carotid bruit and no JVD Lungs: clear to auscultation bilaterally Heart: regular rate and rhythm, S1, S2 normal, no murmur, click, rub or gallop Abdomen: soft, non-tender; bowel sounds normal; no masses,  no organomegaly Extremities: extremities normal, atraumatic, no cyanosis or edema Pulses: 2+ and symmetric Skin: Skin color, texture, turgor normal. No rashes or lesions Neurologic: Grossly normal Psych: Pleasant  EKG: Deferred  ASSESSMENT: 1. Dyslipidemia 2. Hypertension 3. Family history of premature coronary artery disease 4. Hypothyroidism  PLAN: 1.   Mr. Carswell presents for evaluation of coronary artery disease but is essentially asymptomatic. He is not regularly exercising but wishes to get back to  that. I like to assess a coronary artery calcium score to see if he has any premature coronary artery disease which would potentially guide our management as far as intensifying statin therapy to get to a lower LDL. Blood pressure is well controlled. He is on appropriate thyroid treatment. He is on daily aspirin and again is asymptomatic. He is advised to follow his symptoms when he exercises. If there is significant coronary artery calcium with a score greater  than 400, then stress testing would be advised and supported by guideline recommendations.  Follow-up with me after his calcium score to discuss the results. Thanks for the kind referral  Chrystie NoseKenneth C. Hilty, MD, [facility] Attending Cardiologist [facility]  Chrystie NoseKenneth C Hilty 05/04/2016, 3:46 PM

## 2016-05-09 ENCOUNTER — Ambulatory Visit (INDEPENDENT_AMBULATORY_CARE_PROVIDER_SITE_OTHER)
Admission: RE | Admit: 2016-05-09 | Discharge: 2016-05-09 | Disposition: A | Discharge status: Home / Self Care | Payer: Self-pay | Source: Ambulatory Visit | Attending: Internal Medicine | Admitting: Internal Medicine

## 2016-05-09 DIAGNOSIS — E785 Hyperlipidemia, unspecified: Secondary | ICD-10-CM

## 2016-05-09 DIAGNOSIS — I10 Essential (primary) hypertension: Secondary | ICD-10-CM

## 2016-05-09 DIAGNOSIS — Z8249 Family history of ischemic heart disease and other diseases of the circulatory system: Secondary | ICD-10-CM

## 2016-06-05 ENCOUNTER — Ambulatory Visit (INDEPENDENT_AMBULATORY_CARE_PROVIDER_SITE_OTHER): Payer: BLUE CROSS/BLUE SHIELD | Admitting: Internal Medicine

## 2016-06-05 ENCOUNTER — Encounter: Payer: Self-pay | Admitting: Internal Medicine

## 2016-06-05 VITALS — BP 127/80 | HR 61 | Ht 71.0 in | Wt 233.4 lb

## 2016-06-05 DIAGNOSIS — I10 Essential (primary) hypertension: Secondary | ICD-10-CM

## 2016-06-05 DIAGNOSIS — E785 Hyperlipidemia, unspecified: Secondary | ICD-10-CM

## 2016-06-05 DIAGNOSIS — Z8249 Family history of ischemic heart disease and other diseases of the circulatory system: Secondary | ICD-10-CM

## 2016-06-05 DIAGNOSIS — R911 Solitary pulmonary nodule: Secondary | ICD-10-CM | POA: Diagnosis not present

## 2016-06-05 NOTE — Patient Instructions (Addendum)
Your physician wants you to follow-up in: 1 year with Dr. Rennis GoldenHilty. You will receive a reminder letter in the mail two months in advance. If you don't receive a letter, please call our office to schedule the follow-up appointment.  Dr. Rennis GoldenHilty has ordered a chest CT - this will be done in 1 year prior to your next appointment.  It has been [address] to [facility] [address] [facility]

## 2016-06-05 NOTE — Progress Notes (Signed)
OFFICE NOTE  Chief Complaint:  "Brother just had quadruple bypass"  Primary Care Physician: Evette GeorgesDD,JEFFREY ALLEN, MD  HPI:  William Rosales is a 55 y.o. male  who is referred to me for evaluation of possible coronary disease. William Rosales is generally asymptomatic although occasionally gets a cool sensation across his chest when sitting up from laying down. It is not described as a pain or palpitations. He does not feel that when he exercises or is active or lifts heavy objects. His concern is started recently with his brother who had quadruple bypass at age 54. Before that he did not have any family history of coronary disease which was known. His parents had some hypertension and coronary risk factors. He has hypertension and dyslipidemia and hypothyroidism. He is concerned about the possibility of him having coronary artery disease and wanted to "get checked out". He was recently started on Lipitor for dyslipidemia and an LDL of 127.  06/05/2016  William Rosales this returns today for follow-up. I'm pleased to report his coronary artery calcium score was 0. However there was a small nodule noted on his CT scan. This will require a repeat CT scan in one year which I ordered today. We also discussed medications for his cholesterol. In reviewing his LDL cholesterol most recently was 127 with total cholesterol of 199. This was prior to starting low-dose Lipitor 10 mg daily. I would submit that his goal cholesterol should be less than 100 for LDL and he may not achieve this goal on 10 mg. He scheduled to have a repeat lipid profile in a few months and may need up titration of his medication. He is also working on weight loss and diet changes which could be additionally helpful.  PMHx:  Past Medical History  Diagnosis Date  . ADD (attention deficit disorder with hyperactivity)   . Hypertension   . Hypothyroidism     Past Surgical History  Procedure Laterality Date  . Scalp lesion removal w/  flap and skin graft      reports it was benign    FAMHx:  Family History  Problem Relation Age of Onset  . Cancer Maternal Uncle   . Hypertension Other     SOCHx:   reports that he quit smoking about 20 years ago. His smoking use included Cigarettes. He has a 18 pack-year smoking history. He does not have any smokeless tobacco history on file. He reports that he does not drink alcohol or use illicit drugs.  ALLERGIES:  No Known Allergies  ROS: Pertinent items noted in HPI and remainder of comprehensive ROS otherwise negative.  HOME MEDS: Current Outpatient Prescriptions  Medication Sig Dispense Refill  . aspirin 81 MG tablet Take 81 mg by mouth daily.      Marland Kitchen. atorvastatin (LIPITOR) 10 MG tablet Take 1 tablet (10 mg total) by mouth daily. 90 tablet 3  . levothyroxine (SYNTHROID, LEVOTHROID) 75 MCG tablet TAKE 1 TABLET BY MOUTH EVERY DAY **NNEDS OV** 100 tablet 3  . lisinopril (PRINIVIL,ZESTRIL) 30 MG tablet Take 1 tablet (30 mg total) by mouth daily. 100 tablet 3   No current facility-administered medications for this visit.    LABS/IMAGING: No results found for this or any previous visit (from the past 48 hour(s)). No results found.  WEIGHTS: Wt Readings from Last 3 Encounters:  06/05/16 233 lb 6.4 oz (105.87 kg)  05/04/16 230 lb (104.327 kg)  04/12/16 222 lb (100.699 kg)    VITALS: BP 127/80 mmHg  Pulse 61  Ht 5\' 11"  (1.803 m)  Wt 233 lb 6.4 oz (105.87 kg)  BMI 32.57 kg/m2  EXAM: Deferred  EKG: Deferred  ASSESSMENT: 1. Zero coronary artery calcium score (05/2016) 2. Dyslipidemia - goal LDL <100 3. Hypertension 4. Family history of premature coronary artery disease 5. Hypothyroidism  PLAN: 1.   William Rosales fortunately had a 0 coronary artery calcium score which is very reassuring and has a high predictive power of a low risk of coronary events. That being said, based on his family history his 10 year risk is intermediate. I would advise that he continue on  statin therapy with a goal LDL less than 100. He may need adjustment on Lipitor to reach that goal. I'm happy to see him back annually or sooner as necessary for follow-up. As mentioned I have already ordered a follow-up CT scan for his pulmonary nodule which should be about one year from now.  William NoseKenneth C. Kier Smead, MD, [facility] Attending Cardiologist [facility]  William NoseKenneth C Zanyia Silbaugh 06/05/2016, 9:18 AM

## 2016-06-15 ENCOUNTER — Other Ambulatory Visit: Payer: Self-pay | Admitting: Family Medicine

## 2016-06-16 ENCOUNTER — Other Ambulatory Visit: Payer: Self-pay | Admitting: Family Medicine

## 2016-08-18 ENCOUNTER — Other Ambulatory Visit (INDEPENDENT_AMBULATORY_CARE_PROVIDER_SITE_OTHER): Payer: BLUE CROSS/BLUE SHIELD

## 2016-08-18 DIAGNOSIS — E039 Hypothyroidism, unspecified: Secondary | ICD-10-CM

## 2016-08-18 LAB — LIPID PANEL
CHOLESTEROL: 148 mg/dL (ref 0–200)
HDL: 53.4 mg/dL (ref >39.00)
LDL Cholesterol: 85 mg/dL (ref 0–99)
NONHDL: 94.86
TRIGLYCERIDES: 50 mg/dL (ref 0.0–149.0)
Total CHOL/HDL Ratio: 3
VLDL: 10 mg/dL (ref 0.0–40.0)

## 2016-08-18 LAB — HEPATIC FUNCTION PANEL
ALBUMIN: 4.1 g/dL (ref 3.5–5.2)
ALK PHOS: 56 U/L (ref 39–117)
ALT: 32 U/L (ref 0–53)
AST: 20 U/L (ref 0–37)
Bilirubin, Direct: 0.2 mg/dL (ref 0.0–0.3)
Total Bilirubin: 0.8 mg/dL (ref 0.2–1.2)
Total Protein: 6.7 g/dL (ref 6.0–8.3)

## 2016-10-09 ENCOUNTER — Encounter: Payer: Self-pay | Admitting: Family Medicine

## 2016-10-09 ENCOUNTER — Ambulatory Visit (INDEPENDENT_AMBULATORY_CARE_PROVIDER_SITE_OTHER): Payer: BLUE CROSS/BLUE SHIELD | Admitting: Family Medicine

## 2016-10-09 VITALS — BP 136/84 | HR 62 | Temp 98.3°F | Ht 71.0 in | Wt 242.0 lb

## 2016-10-09 DIAGNOSIS — Z23 Encounter for immunization: Secondary | ICD-10-CM | POA: Diagnosis not present

## 2016-10-09 DIAGNOSIS — L989 Disorder of the skin and subcutaneous tissue, unspecified: Secondary | ICD-10-CM

## 2016-10-09 NOTE — Progress Notes (Signed)
   Subjective:    Patient ID: William Rosales, male    DOB: [birth year], 55 y.o.   MRN: [id number]  HPI Here for 2 days of seeing a small amount of bleeding around the belly button. He has noticed a small bulge on the side wall of the belly button for a year or so. He had no symptoms with it until about a week ago he felt a slight sharp pain in the area when he would sit up or stand. No pain since then. His BMs are regular.    Review of Systems  Constitutional: Negative.   Respiratory: Negative.   Cardiovascular: Negative.   Gastrointestinal: Positive for abdominal pain. Negative for abdominal distention, anal bleeding, blood in stool, constipation, diarrhea, nausea, rectal pain and vomiting.  Genitourinary: Negative.        Objective:   Physical Exam  Constitutional: He appears well-developed and well-nourished.  Cardiovascular: Normal rate, regular rhythm, normal heart sounds and intact distal pulses.   Pulmonary/Chest: Effort normal and breath sounds normal.  Abdominal: Soft. Bowel sounds are normal. He exhibits no distension. There is no tenderness. There is no rebound and no guarding.  There is a small bluish lump on the left side wall of the umbilicus that resembles a varicose vein. No active bleeding. It is not tender. No hernias are felt.           Assessment & Plan:  Venous varicosity at the umbilicus. We will refer to Surgery to consider removal.  Nelwyn SalisburyFRY,STEPHEN A, MD

## 2016-10-09 NOTE — Progress Notes (Signed)
Pre visit review using our clinic review tool, if applicable. No additional management support is needed unless otherwise documented below in the visit note. 

## 2016-10-19 DIAGNOSIS — R198 Other specified symptoms and signs involving the digestive system and abdomen: Secondary | ICD-10-CM | POA: Diagnosis not present

## 2016-10-23 ENCOUNTER — Other Ambulatory Visit: Payer: Self-pay | Admitting: Surgery

## 2016-10-23 DIAGNOSIS — R198 Other specified symptoms and signs involving the digestive system and abdomen: Secondary | ICD-10-CM

## 2016-11-02 ENCOUNTER — Other Ambulatory Visit: Payer: Self-pay | Admitting: Surgery

## 2016-11-02 ENCOUNTER — Ambulatory Visit
Admission: RE | Admit: 2016-11-02 | Discharge: 2016-11-02 | Disposition: A | Discharge status: Home / Self Care | Payer: BLUE CROSS/BLUE SHIELD | Source: Ambulatory Visit | Attending: Surgery | Admitting: Surgery

## 2016-11-02 DIAGNOSIS — R198 Other specified symptoms and signs involving the digestive system and abdomen: Secondary | ICD-10-CM

## 2017-01-08 DIAGNOSIS — H524 Presbyopia: Secondary | ICD-10-CM | POA: Diagnosis not present

## 2017-01-31 ENCOUNTER — Other Ambulatory Visit: Payer: Self-pay | Admitting: Family Medicine

## 2017-02-01 ENCOUNTER — Telehealth: Payer: Self-pay | Admitting: Family Medicine

## 2017-02-01 ENCOUNTER — Other Ambulatory Visit: Payer: Self-pay | Admitting: Family Medicine

## 2017-02-01 DIAGNOSIS — Z0000 Encounter for general adult medical examination without abnormal findings: Secondary | ICD-10-CM

## 2017-02-01 NOTE — Telephone Encounter (Signed)
Pt due for cpx and labs in May.  Lab orders placed.  Please help him to make both appointments.  Thanks!!!

## 2017-02-01 NOTE — Telephone Encounter (Signed)
Sent to the pharmacy by e-scribe for 90 days.  Pt due back in May for yearly and labs.  Message sent to scheduling.

## 2017-02-02 NOTE — Telephone Encounter (Signed)
Pt has been sch

## 2017-04-07 ENCOUNTER — Other Ambulatory Visit: Payer: Self-pay | Admitting: Family Medicine

## 2017-04-11 ENCOUNTER — Other Ambulatory Visit (INDEPENDENT_AMBULATORY_CARE_PROVIDER_SITE_OTHER): Payer: BLUE CROSS/BLUE SHIELD

## 2017-04-11 DIAGNOSIS — Z0000 Encounter for general adult medical examination without abnormal findings: Secondary | ICD-10-CM

## 2017-04-11 LAB — CBC WITH DIFFERENTIAL/PLATELET
BASOS ABS: 0 10*3/uL (ref 0.0–0.1)
Basophils Relative: 0.3 % (ref 0.0–3.0)
Eosinophils Absolute: 0.1 10*3/uL (ref 0.0–0.7)
Eosinophils Relative: 1.9 % (ref 0.0–5.0)
HCT: 42.9 % (ref 39.0–52.0)
Hemoglobin: 14.5 g/dL (ref 13.0–17.0)
LYMPHS ABS: 2 10*3/uL (ref 0.7–4.0)
Lymphocytes Relative: 33.6 % (ref 12.0–46.0)
MCHC: 33.8 g/dL (ref 30.0–36.0)
MCV: 86.6 fl (ref 78.0–100.0)
Monocytes Absolute: 0.5 10*3/uL (ref 0.1–1.0)
Monocytes Relative: 8.5 % (ref 3.0–12.0)
NEUTROS ABS: 3.4 10*3/uL (ref 1.4–7.7)
NEUTROS PCT: 55.7 % (ref 43.0–77.0)
Platelets: 235 10*3/uL (ref 150.0–400.0)
RBC: 4.96 Mil/uL (ref 4.22–5.81)
RDW: 13.4 % (ref 11.5–15.5)
WBC: 6 10*3/uL (ref 4.0–10.5)

## 2017-04-11 LAB — HEPATIC FUNCTION PANEL
ALK PHOS: 57 U/L (ref 39–117)
ALT: 18 U/L (ref 0–53)
AST: 15 U/L (ref 0–37)
Albumin: 4.6 g/dL (ref 3.5–5.2)
BILIRUBIN DIRECT: 0.2 mg/dL (ref 0.0–0.3)
BILIRUBIN TOTAL: 0.9 mg/dL (ref 0.2–1.2)
TOTAL PROTEIN: 6.9 g/dL (ref 6.0–8.3)

## 2017-04-11 LAB — LIPID PANEL
CHOLESTEROL: 149 mg/dL (ref 0–200)
HDL: 60.4 mg/dL (ref >39.00)
LDL Cholesterol: 77 mg/dL (ref 0–99)
NONHDL: 88.16
Total CHOL/HDL Ratio: 2
Triglycerides: 54 mg/dL (ref 0.0–149.0)
VLDL: 10.8 mg/dL (ref 0.0–40.0)

## 2017-04-11 LAB — BASIC METABOLIC PANEL
BUN: 24 mg/dL — AB (ref 6–23)
CALCIUM: 9.7 mg/dL (ref 8.4–10.5)
CHLORIDE: 105 meq/L (ref 96–112)
CO2: 29 mEq/L (ref 19–32)
CREATININE: 1.04 mg/dL (ref 0.40–1.50)
GFR: 78.77 mL/min (ref >60.00)
Glucose, Bld: 112 mg/dL — ABNORMAL HIGH (ref 70–99)
Potassium: 4.6 mEq/L (ref 3.5–5.1)
Sodium: 140 mEq/L (ref 135–145)

## 2017-04-11 LAB — HEMOGLOBIN A1C: Hgb A1c MFr Bld: 5.7 % (ref 4.6–6.5)

## 2017-04-11 LAB — TSH: TSH: 3.56 u[IU]/mL (ref 0.35–4.50)

## 2017-04-11 LAB — PSA: PSA: 1.55 ng/mL (ref 0.10–4.00)

## 2017-04-18 ENCOUNTER — Encounter: Payer: Self-pay | Admitting: Family Medicine

## 2017-04-18 ENCOUNTER — Ambulatory Visit (INDEPENDENT_AMBULATORY_CARE_PROVIDER_SITE_OTHER): Payer: BLUE CROSS/BLUE SHIELD | Admitting: Family Medicine

## 2017-04-18 VITALS — BP 126/86 | Temp 97.9°F | Ht 70.0 in | Wt 230.0 lb

## 2017-04-18 DIAGNOSIS — E663 Overweight: Secondary | ICD-10-CM | POA: Diagnosis not present

## 2017-04-18 DIAGNOSIS — R739 Hyperglycemia, unspecified: Secondary | ICD-10-CM

## 2017-04-18 DIAGNOSIS — I10 Essential (primary) hypertension: Secondary | ICD-10-CM

## 2017-04-18 DIAGNOSIS — R911 Solitary pulmonary nodule: Secondary | ICD-10-CM | POA: Diagnosis not present

## 2017-04-18 DIAGNOSIS — Z136 Encounter for screening for cardiovascular disorders: Secondary | ICD-10-CM

## 2017-04-18 DIAGNOSIS — Z0000 Encounter for general adult medical examination without abnormal findings: Secondary | ICD-10-CM

## 2017-04-18 DIAGNOSIS — E039 Hypothyroidism, unspecified: Secondary | ICD-10-CM | POA: Diagnosis not present

## 2017-04-18 MED ORDER — LISINOPRIL 30 MG PO TABS: 30.0000 mg | ORAL_TABLET | Freq: Every day | ORAL | 4 refills | Status: DC

## 2017-04-18 MED ORDER — ATORVASTATIN CALCIUM 10 MG PO TABS: 10.0000 mg | ORAL_TABLET | Freq: Every day | ORAL | 4 refills | Status: DC

## 2017-04-18 MED ORDER — LEVOTHYROXINE SODIUM 75 MCG PO TABS: ORAL_TABLET | ORAL | 4 refills | Status: DC

## 2017-04-18 NOTE — Patient Instructions (Signed)
Continue your current medications  Work hard this year on the diet and exercise and weight loss program,,,,,,,,,, no sugar  Walk 30 minutes daily  Follow-up fasting blood sugar and A1c in 6 months  Consider the shingles vaccine.

## 2017-04-18 NOTE — Progress Notes (Signed)
William Rosales is a 56 year old married male nonsmoker who comes in today for general physical examination  He's eyes been in excellent health. He does have 3 problems  His history of hyperlipidemia on Lipitor 10 mg daily along with his aspirin tablet LDL is 77  He takes Synthroid 75 g daily because of a history of hypothyroidism. Recent TSH level was normal.  He takes lisinopril 30 mg daily for blood pressure control. BP 126/86 at goal.  He gets routine eye care, dental care, colonoscopy when he was 48 was normal. Due his next colonoscopy when he 58.  Vaccinations up-to-date. Recommend shingles vaccine  He has a lesion on his right hand he would like checked.  He went to see a cardiologist after his brother had bypass surgery. Cardiac evaluation was negative. He did have 5 mm pulmonary nodule that he told to get follow-up in a year.  Social history [place] is [place] he does not exercise on a daily basis. Works Therapist, [employer] business.  14 point review of systems reviewed and otherwise negative  EKG was done because of a history of hypertension and high cholesterol. EKG was normal and unchanged.  BP 126/86 (BP Location: Right Arm)   Temp 97.9 F (36.6 C) (Oral)   Ht 5\' 10"  (1.778 m)   Wt 230 lb (104.3 kg)   BMI 33.00 kg/m  Examination of the HEENT were negative neck was supple thyroid not enlarged no carotid bruits cardiopulmonary exam normal abdominal exam normal genitalia normal circumcised male rectal normal stool guaiac-negative prostate normal extremities normal skin normal peripheral pulses normal  #1 hypertension at goal.......... continue current therapy  #2 hyperlipidemia goal........ continue current care.  #3 hypothyroidism......... TSH normal on Synthroid 75 g daily  #4 pulmonary nodule......... follow-up as outlined by radiology.

## 2017-06-05 ENCOUNTER — Other Ambulatory Visit: Payer: Self-pay | Admitting: *Deleted

## 2017-06-05 DIAGNOSIS — R911 Solitary pulmonary nodule: Secondary | ICD-10-CM

## 2017-06-12 ENCOUNTER — Other Ambulatory Visit: Payer: Self-pay | Admitting: Family Medicine

## 2017-06-12 ENCOUNTER — Ambulatory Visit (INDEPENDENT_AMBULATORY_CARE_PROVIDER_SITE_OTHER)
Admission: RE | Admit: 2017-06-12 | Discharge: 2017-06-12 | Disposition: A | Discharge status: Home / Self Care | Payer: BLUE CROSS/BLUE SHIELD | Source: Ambulatory Visit | Attending: Internal Medicine | Admitting: Internal Medicine

## 2017-06-12 DIAGNOSIS — R911 Solitary pulmonary nodule: Secondary | ICD-10-CM

## 2017-06-12 DIAGNOSIS — R918 Other nonspecific abnormal finding of lung field: Secondary | ICD-10-CM | POA: Diagnosis not present

## 2017-06-12 NOTE — Telephone Encounter (Signed)
Denied.  Filled for 1 year 04/2017.  Message sent back to the pharmacy to check file.

## 2017-06-13 ENCOUNTER — Encounter: Payer: Self-pay | Admitting: Internal Medicine

## 2017-06-13 ENCOUNTER — Ambulatory Visit (INDEPENDENT_AMBULATORY_CARE_PROVIDER_SITE_OTHER): Payer: BLUE CROSS/BLUE SHIELD | Admitting: Internal Medicine

## 2017-06-13 VITALS — BP 118/82 | HR 61 | Ht 71.0 in | Wt 244.4 lb

## 2017-06-13 DIAGNOSIS — E785 Hyperlipidemia, unspecified: Secondary | ICD-10-CM

## 2017-06-13 DIAGNOSIS — Z8249 Family history of ischemic heart disease and other diseases of the circulatory system: Secondary | ICD-10-CM | POA: Diagnosis not present

## 2017-06-13 DIAGNOSIS — R911 Solitary pulmonary nodule: Secondary | ICD-10-CM

## 2017-06-13 DIAGNOSIS — I10 Essential (primary) hypertension: Secondary | ICD-10-CM

## 2017-06-13 NOTE — Progress Notes (Signed)
Grossly normal   OFFICE NOTE  Chief Complaint:  No complaints  Primary Care Physician: Roderick Pee, MD  HPI:  William Rosales is a 57 y.o. male  who is referred to me for evaluation of possible coronary disease. William Rosales is generally asymptomatic although occasionally gets a cool sensation across his chest when sitting up from laying down. It is not described as a pain or palpitations. He does not feel that when he exercises or is active or lifts heavy objects. His concern is started recently with his brother who had quadruple bypass at age 26. Before that he did not have any family history of coronary disease which was known. His parents had some hypertension and coronary risk factors. He has hypertension and dyslipidemia and hypothyroidism. He is concerned about the possibility of him having coronary artery disease and wanted to "get checked out". He was recently started on Lipitor for dyslipidemia and an LDL of 127.  06/05/2016  William Rosales this returns today for follow-up. I'm pleased to report his coronary artery calcium score was 0. However there was a small nodule noted on his CT scan. This will require a repeat CT scan in one year which I ordered today. We also discussed medications for his cholesterol. In reviewing his LDL cholesterol most recently was 127 with total cholesterol of 199. This was prior to starting low-dose Lipitor 10 mg daily. I would submit that his goal cholesterol should be less than 100 for LDL and he may not achieve this goal on 10 mg. He scheduled to have a repeat lipid profile in a few months and may need up titration of his medication. He is also working on weight loss and diet changes which could be additionally helpful.  06/13/2017  William Rosales was seen today in follow-up. He continues to be asymptomatic. As briefly mentioned had a corner calcium score of 0. He's on low-dose Lipitor 10 mg daily with an excellent lipid profile. He is at goal LDL less  than 100. His repeat CT scan showed stable pulmonary nodules and some mild emphysematous changes. He reports he was a smoker but quit many years ago.  PMHx:  Past Medical History:  Diagnosis Date  . ADD (attention deficit disorder with hyperactivity)   . Hypertension   . Hypothyroidism     Past Surgical History:  Procedure Laterality Date  . SCALP LESION REMOVAL W/ FLAP AND SKIN GRAFT     reports it was benign    FAMHx:  Family History  Problem Relation Age of Onset  . Cancer Maternal Uncle   . Hypertension Other     SOCHx:   reports that he quit smoking about 21 years ago. His smoking use included Cigarettes. He has a 18.00 pack-year smoking history. He has never used smokeless tobacco. He reports that he drinks about 7.2 oz of alcohol per week . He reports that he does not use drugs.  ALLERGIES:  No Known Allergies  ROS: Pertinent items noted in HPI and remainder of comprehensive ROS otherwise negative.  HOME MEDS: Current Outpatient Prescriptions  Medication Sig Dispense Refill  . aspirin 81 MG tablet Take 81 mg by mouth daily.      Marland Kitchen atorvastatin (LIPITOR) 10 MG tablet Take 1 tablet (10 mg total) by mouth daily. 90 tablet 4  . Coenzyme Q10 (CO Q 10 PO) Take 1 capsule by mouth daily.     Marland Kitchen levothyroxine (SYNTHROID, LEVOTHROID) 75 MCG tablet TAKE 1 TABLET BY MOUTH EVERY DAY **  NNEDS OV** 100 tablet 4  . lisinopril (PRINIVIL,ZESTRIL) 30 MG tablet Take 1 tablet (30 mg total) by mouth daily. 90 tablet 4   No current facility-administered medications for this visit.     LABS/IMAGING: No results found for this or any previous visit (from the past 48 hour(s)). Ct Chest Wo Contrast  Result Date: 06/12/2017 CLINICAL DATA:  Followup pulmonary nodules. EXAM: CT CHEST WITHOUT CONTRAST TECHNIQUE: Multidetector CT imaging of the chest was performed following the standard protocol without IV contrast. COMPARISON:  05/09/2016 FINDINGS: Cardiovascular: The heart is normal in size. No  pericardial effusion. The aorta is normal in caliber. Minimal scattered atherosclerotic calcifications. No definite coronary artery calcifications. Mediastinum/Nodes: No mediastinal or hilar mass or adenopathy. The esophagus is grossly normal. Lungs/Pleura: Stable mild emphysematous changes. No acute pulmonary findings. Stable 3 mm right upper lobe pulmonary nodule on image number 84. Stable 4 mm nodule in the right lower lobe on image number 82. Stable subpleural pulmonary nodule in the right lower lobe on image number 111 measuring 4 mm. A few other scattered small subpulmonic nodules are noted. No worrisome pulmonary lesions or pleural effusion. Upper Abdomen: No significant upper abdominal findings. Chest wall/ Musculoskeletal: No chest wall mass, supraclavicular or axillary lymphadenopathy. Small scattered lymph nodes are noted. The thyroid gland appears normal. No significant bony findings. IMPRESSION: Stable mild emphysematous changes but no acute pulmonary findings. Stable small scattered pulmonary nodules. Recommend followup noncontrast chest CT in 1 year to document 2 years of stability. Aortic Atherosclerosis (ICD10-I70.0) and Emphysema (ICD10-J43.9). Electronically Signed   By: Rudie MeyerP.  Gallerani M.D.   On: 06/12/2017 16:15    WEIGHTS: Wt Readings from Last 3 Encounters:  06/13/17 244 lb 6.4 oz (110.9 kg)  04/18/17 230 lb (104.3 kg)  10/09/16 242 lb (109.8 kg)    VITALS: BP 118/82   Pulse 61   Ht 5\' 11"  (1.803 m)   Wt 244 lb 6.4 oz (110.9 kg)   BMI 34.09 kg/m   EXAM: General appearance: alert and no distress Neck: no carotid bruit, no JVD and thyroid not enlarged, symmetric, no tenderness/mass/nodules Lungs: clear to auscultation bilaterally Heart: regular rate and rhythm, S1, S2 normal, no murmur, click, rub or gallop Abdomen: soft, non-tender; bowel sounds normal; no masses,  no organomegaly Extremities: extremities normal, atraumatic, no cyanosis or edema Pulses: 2+ and  symmetric Skin: Skin color, texture, turgor normal. No rashes or lesions Neurologic: GroPsych: Pleasanty normal Psych: Pleasant  EKG: Normal sinus rhythm at 61-personally reviewed  ASSESSMENT: 1. Zero coronary artery calcium score (05/2016) 2. Dyslipidemia - goal LDL <100 3. Hypertension 4. Family history of premature coronary artery disease 5. Hypothyroidism 6. Stable subcentimeter pulmonary nodules  PLAN: 1.   William Rosales seems to be doing well on his current regimen. He has stable subcentimeter pulmonary nodules which are require an additional CT scan in one year to continue to assure stability. Blood pressure is at goal. He is asymptomatic. He is at goal LDL cholesterol. Plan follow-up in one year after his repeat CT scan.  William NoseKenneth C. Aylla Huffine, MD, [facility] Attending Cardiologist [facility]  William NoseKenneth C Raevyn Rosales 06/13/2017, 5:28 PM

## 2017-06-13 NOTE — Patient Instructions (Signed)
Your physician wants you to follow-up in: ONE YEAR with Dr. Rennis GoldenHilty. You will receive a reminder letter in the mail two months in advance. If you don't receive a letter, please call our office to schedule the follow-up appointment.  Non-Cardiac CT scanning in ONE YEAR, (CAT scanning), is a noninvasive, special x-ray that produces cross-sectional images of the body using x-rays and a computer. CT scans help physicians diagnose and treat medical conditions. For some CT exams, a contrast material is used to enhance visibility in the area of the body being studied. CT scans provide greater clarity and reveal more details than regular x-ray exams.

## 2017-06-21 ENCOUNTER — Other Ambulatory Visit: Payer: Self-pay | Admitting: Family Medicine

## 2017-06-25 NOTE — Telephone Encounter (Signed)
Pt need new Rx for levothyroxine  [facility] [address]

## 2017-06-25 NOTE — Telephone Encounter (Signed)
Called and spoke to the pharmacy.  Pt must be using old prescription #.  They did receive refills from 04/2017 Tawanna Cooler(Todd filled for 1 year) and will get ready for the pt to pick up.

## 2017-08-24 ENCOUNTER — Encounter: Payer: Self-pay | Admitting: Family Medicine

## 2017-10-10 ENCOUNTER — Other Ambulatory Visit: Payer: BLUE CROSS/BLUE SHIELD

## 2017-10-17 ENCOUNTER — Ambulatory Visit: Payer: BLUE CROSS/BLUE SHIELD | Admitting: Family Medicine

## 2018-02-15 DIAGNOSIS — S0502XA Injury of conjunctiva and corneal abrasion without foreign body, left eye, initial encounter: Secondary | ICD-10-CM | POA: Diagnosis not present

## 2018-04-23 ENCOUNTER — Encounter: Payer: BLUE CROSS/BLUE SHIELD | Admitting: Family Medicine

## 2018-04-28 ENCOUNTER — Other Ambulatory Visit: Payer: Self-pay | Admitting: Family Medicine

## 2018-05-02 ENCOUNTER — Ambulatory Visit (INDEPENDENT_AMBULATORY_CARE_PROVIDER_SITE_OTHER): Payer: BLUE CROSS/BLUE SHIELD | Admitting: Family Medicine

## 2018-05-02 ENCOUNTER — Encounter: Payer: Self-pay | Admitting: Family Medicine

## 2018-05-02 VITALS — BP 126/86 | HR 84 | Temp 97.7°F | Ht 71.0 in | Wt 235.0 lb

## 2018-05-02 DIAGNOSIS — Z125 Encounter for screening for malignant neoplasm of prostate: Secondary | ICD-10-CM | POA: Diagnosis not present

## 2018-05-02 DIAGNOSIS — Z0000 Encounter for general adult medical examination without abnormal findings: Secondary | ICD-10-CM | POA: Diagnosis not present

## 2018-05-02 DIAGNOSIS — E785 Hyperlipidemia, unspecified: Secondary | ICD-10-CM

## 2018-05-02 DIAGNOSIS — I10 Essential (primary) hypertension: Secondary | ICD-10-CM | POA: Diagnosis not present

## 2018-05-02 DIAGNOSIS — E663 Overweight: Secondary | ICD-10-CM | POA: Diagnosis not present

## 2018-05-02 DIAGNOSIS — E039 Hypothyroidism, unspecified: Secondary | ICD-10-CM

## 2018-05-02 LAB — POCT URINALYSIS DIPSTICK
Bilirubin, UA: NEGATIVE
Glucose, UA: NEGATIVE
KETONES UA: NEGATIVE
LEUKOCYTES UA: NEGATIVE
NITRITE UA: NEGATIVE
Protein, UA: NEGATIVE
RBC UA: NEGATIVE
SPEC GRAV UA: 1.025 (ref 1.010–1.025)
Urobilinogen, UA: 0.2 E.U./dL
pH, UA: 6 (ref 5.0–8.0)

## 2018-05-02 LAB — HEPATIC FUNCTION PANEL
ALBUMIN: 4.7 g/dL (ref 3.5–5.2)
ALT: 27 U/L (ref 0–53)
AST: 19 U/L (ref 0–37)
Alkaline Phosphatase: 52 U/L (ref 39–117)
Bilirubin, Direct: 0.3 mg/dL (ref 0.0–0.3)
Total Bilirubin: 1.6 mg/dL — ABNORMAL HIGH (ref 0.2–1.2)
Total Protein: 7.3 g/dL (ref 6.0–8.3)

## 2018-05-02 LAB — BASIC METABOLIC PANEL
BUN: 21 mg/dL (ref 6–23)
CHLORIDE: 102 meq/L (ref 96–112)
CO2: 28 mEq/L (ref 19–32)
CREATININE: 0.99 mg/dL (ref 0.40–1.50)
Calcium: 9.5 mg/dL (ref 8.4–10.5)
GFR: 83.06 mL/min (ref >60.00)
Glucose, Bld: 118 mg/dL — ABNORMAL HIGH (ref 70–99)
Potassium: 4.4 mEq/L (ref 3.5–5.1)
SODIUM: 137 meq/L (ref 135–145)

## 2018-05-02 LAB — CBC WITH DIFFERENTIAL/PLATELET
BASOS ABS: 0 10*3/uL (ref 0.0–0.1)
Basophils Relative: 0.2 % (ref 0.0–3.0)
Eosinophils Absolute: 0.1 10*3/uL (ref 0.0–0.7)
Eosinophils Relative: 1.5 % (ref 0.0–5.0)
HCT: 42.4 % (ref 39.0–52.0)
Hemoglobin: 14.6 g/dL (ref 13.0–17.0)
LYMPHS ABS: 1.5 10*3/uL (ref 0.7–4.0)
Lymphocytes Relative: 28.7 % (ref 12.0–46.0)
MCHC: 34.4 g/dL (ref 30.0–36.0)
MCV: 86.8 fl (ref 78.0–100.0)
MONO ABS: 0.6 10*3/uL (ref 0.1–1.0)
Monocytes Relative: 11.4 % (ref 3.0–12.0)
NEUTROS PCT: 58.2 % (ref 43.0–77.0)
Neutro Abs: 2.9 10*3/uL (ref 1.4–7.7)
Platelets: 206 10*3/uL (ref 150.0–400.0)
RBC: 4.88 Mil/uL (ref 4.22–5.81)
RDW: 12.9 % (ref 11.5–15.5)
WBC: 5.1 10*3/uL (ref 4.0–10.5)

## 2018-05-02 LAB — LIPID PANEL
CHOLESTEROL: 172 mg/dL (ref 0–200)
HDL: 64.3 mg/dL (ref >39.00)
LDL Cholesterol: 92 mg/dL (ref 0–99)
NonHDL: 107.98
Total CHOL/HDL Ratio: 3
Triglycerides: 78 mg/dL (ref 0.0–149.0)
VLDL: 15.6 mg/dL (ref 0.0–40.0)

## 2018-05-02 LAB — TSH: TSH: 4.72 u[IU]/mL — AB (ref 0.35–4.50)

## 2018-05-02 LAB — PSA: PSA: 1.67 ng/mL (ref 0.10–4.00)

## 2018-05-02 MED ORDER — ATORVASTATIN CALCIUM 10 MG PO TABS: 10.0000 mg | ORAL_TABLET | Freq: Every day | ORAL | 4 refills | Status: DC

## 2018-05-02 MED ORDER — LEVOTHYROXINE SODIUM 75 MCG PO TABS: ORAL_TABLET | ORAL | 4 refills | Status: DC

## 2018-05-02 NOTE — Patient Instructions (Signed)
Labs today......... I will call you if this anything abnormal  Continue current meds  Follow-up in one year sooner if any problems  Get back on your exercise program and carbohydrate free diet.......Marland Kitchen weight goal 219

## 2018-05-02 NOTE — Progress Notes (Signed)
William Rosales is a 57 year old married male nonsmoker who comes in today for general physical examination because of a history of hypertension hyperlipidemia  For hypertension takes lisinopril 30 mg daily. BP 126/86. BP at home 125/76.  For hyperlipidemia takes aspirin and a 10 mg Lipitor tablet at bedtime. Last LDL a year ago was 677. We'll repeat today  He's noticed alternating decreased hearing. He says it comes and goes. His hearing was diminished in the middle come back. It's a high frequency type hearing loss. He says deafness runs in his family. We'll get him set up to see the audiologist  He gets routine eye care, dental care, colonoscopy 2010 were normal  Vaccinations up-to-date tetanus booster 2010.  He is due to go back to see cardiology in July. He was seen there last year because of his concern about possible coronary artery disease. His calcium score was 0. However his brother had quadruple bypass a year ago. He had some pulmonary nodules which are stable. He's due for follow-up CT scan.  Social history...........Marland Kitchen married lives here in [place] 2 boys grown and gone. He owns and runs a Location manager. His wife Vernona Rieger is a nurse who now works from their boat at [employer]  BP 126/86 (BP Location: Left Arm, Patient Position: Sitting, Cuff Size: Large)   Pulse 84   Temp 97.7 F (36.5 C) (Oral)   Ht  (1.803 m)   Wt 235 lb (106.6 kg)   BMI 32.78 kg/m  Well-developed well-nourished male no acute distress vital signs stable is afebrile except his weight. He says he got down to 219 with diet and exercise but he said some vacation and get up now to 235.  Examination HEENT were negative neck was supple thyroid is not enlarged no carotid bruits. Cardiopulmonary exam normal. Abdominal exam normal. Genitalia normal circumcised male. Rectal normal stool guaiac-negative prostate smooth nonnodular 1+ BPH..... Admits to nocturia 2  Extremities normal skin no peripheral pulses  normal  #1 hypertension at goal......... continue current therapy check labs  #2 hyperlipidemia....... continue Lipitor and aspirin..... Check labs  #3 alternating right and left hearing loss........... ENT consult with Collins [place] at [facility].  #4 history of pulmonary nodules.......... follow-up CT scan as outlined  #5 overweight............. discussed diet exercise and weight loss. #6  Hypothyroidism........ continue Synthroid

## 2018-06-12 ENCOUNTER — Other Ambulatory Visit: Payer: Self-pay | Admitting: Family Medicine

## 2018-07-14 ENCOUNTER — Emergency Department (HOSPITAL_BASED_OUTPATIENT_CLINIC_OR_DEPARTMENT_OTHER)
Admission: EM | Admit: 2018-07-14 | Discharge: 2018-07-14 | Disposition: A | Discharge status: Home / Self Care | Payer: BLUE CROSS/BLUE SHIELD | Attending: Emergency Medicine | Admitting: Emergency Medicine

## 2018-07-14 ENCOUNTER — Encounter (HOSPITAL_BASED_OUTPATIENT_CLINIC_OR_DEPARTMENT_OTHER): Payer: Self-pay | Admitting: Emergency Medicine

## 2018-07-14 ENCOUNTER — Other Ambulatory Visit: Payer: Self-pay

## 2018-07-14 DIAGNOSIS — I10 Essential (primary) hypertension: Secondary | ICD-10-CM | POA: Insufficient documentation

## 2018-07-14 DIAGNOSIS — H43391 Other vitreous opacities, right eye: Secondary | ICD-10-CM | POA: Insufficient documentation

## 2018-07-14 DIAGNOSIS — E039 Hypothyroidism, unspecified: Secondary | ICD-10-CM | POA: Diagnosis not present

## 2018-07-14 DIAGNOSIS — F909 Attention-deficit hyperactivity disorder, unspecified type: Secondary | ICD-10-CM | POA: Insufficient documentation

## 2018-07-14 DIAGNOSIS — Z79899 Other long term (current) drug therapy: Secondary | ICD-10-CM | POA: Insufficient documentation

## 2018-07-14 DIAGNOSIS — Z7982 Long term (current) use of aspirin: Secondary | ICD-10-CM | POA: Insufficient documentation

## 2018-07-14 DIAGNOSIS — Z87891 Personal history of nicotine dependence: Secondary | ICD-10-CM | POA: Insufficient documentation

## 2018-07-14 DIAGNOSIS — H538 Other visual disturbances: Secondary | ICD-10-CM | POA: Diagnosis present

## 2018-07-14 NOTE — ED Triage Notes (Signed)
Patient states that has is having a new " floater" to his right eye peripheral vision, and a dar " stringing , wiry" thing in the right peripheral vision.

## 2018-07-14 NOTE — Discharge Instructions (Signed)
Call your ophthalmologist about follow-up for tomorrow.  Return for worsening of symptoms.  If you cannot follow-up with your ophthalmologist follow-up with Dr. Cathey EndowBowen.

## 2018-07-14 NOTE — ED Notes (Addendum)
ED Provider at bedside, using u/s for bedside exam of right eye

## 2018-07-14 NOTE — ED Provider Notes (Signed)
[facility] EMERGENCY DEPARTMENT Provider Note   CSN: [id number] Arrival date & time: 07/14/18  1305     History   Chief Complaint Chief Complaint  Patient presents with  . Visual Field Change    HPI Zakhi Dupre Vowell is a 57 y.o. male.  HPI Patient with vision change.  Right eye.  Start with a flash earlier this morning.  Would come and go.  Then had what seemed like floaters.  Squiggly lines in the right eye laterally but a little more medial than the flashes.  They moved a little bit but not like his previous ocular migraine.  Has curved line now that we will see some flashes on it if he moves his eyes the wrong way.  Still good vision otherwise.  Good peripheral vision.  No eye pain.  Is seen Dr. Lorin Picket at [facility] in the past.  Thinks he can get in to see him tomorrow.  Not on anticoagulation.  Patient is worried about a retinal detachment. Past Medical History:  Diagnosis Date  . ADD (attention deficit disorder with hyperactivity)   . Hypertension   . Hypothyroidism     Patient Active Problem List   Diagnosis Date Noted  . Pulmonary nodule seen on imaging study 06/05/2016  . Dyslipidemia 05/04/2016  . Family history of coronary artery disease 05/04/2016  . Family history of coronary artery disease in brother 04/12/2016  . Overweight 02/15/2016  . Elevated blood sugar 02/15/2016  . Routine general medical examination at a health care facility 04/13/2015  . Hypersomnia with sleep apnea, unspecified 05/31/2011  . Hypothyroidism 03/17/2008  . Essential hypertension 03/17/2008    Past Surgical History:  Procedure Laterality Date  . SCALP LESION REMOVAL W/ FLAP AND SKIN GRAFT     reports it was benign        Home Medications    Prior to Admission medications   Medication Sig Start Date End Date Taking? Authorizing Provider  aspirin 81 MG tablet Take 81 mg by mouth daily.      [provider]  atorvastatin (LIPITOR) 10 MG tablet Take  1 tablet (10 mg total) by mouth daily. 05/02/18   Roderick Pee, MD  Coenzyme Q10 (CO Q 10 PO) Take 1 capsule by mouth daily.     [provider]  levothyroxine (SYNTHROID, LEVOTHROID) 75 MCG tablet TAKE 1 TABLET BY MOUTH EVERY DAY **NNEDS OV** 05/02/18   Roderick Pee, MD  lisinopril (PRINIVIL,ZESTRIL) 30 MG tablet TAKE 1 TABLET BY MOUTH EVERY DAY 04/30/18   Roderick Pee, MD    Family History Family History  Problem Relation Age of Onset  . Cancer Maternal Uncle   . Hypertension Other     Social History Social History   Tobacco Use  . Smoking status: Former Smoker    Packs/day: 1.00    Years: 18.00    Pack years: 18.00    Types: Cigarettes    Last attempt to quit: 02/21/1996    Years since quitting: 22.4  . Smokeless tobacco: Never Used  Substance Use Topics  . Alcohol use: Yes    Alcohol/week: 12.0 standard drinks    Types: 8 Glasses of wine, 4 Cans of beer per week  . Drug use: No     Allergies   Patient has no known allergies.   Review of Systems Review of Systems  Constitutional: Negative for appetite change.  HENT: Negative for congestion.   Eyes: Positive for visual disturbance. Negative for  photophobia, discharge and itching.  Respiratory: Negative for shortness of breath.   Cardiovascular: Negative for chest pain.  Gastrointestinal: Negative for abdominal pain.  Genitourinary: Negative for flank pain.  Musculoskeletal: Negative for back pain.  Skin: Negative for rash.  Neurological: Negative for weakness.     Physical Exam Updated Vital Signs BP 113/77 (BP Location: Left Arm)   Pulse 62   Temp 98.5 F (36.9 C) (Oral)   Resp 18   Wt 106.6 kg   SpO2 100%   BMI 32.78 kg/m   Physical Exam  Constitutional: He appears well-developed.  HENT:  Head: Atraumatic.  Eyes: Pupils are equal, round, and reactive to light. EOM are normal.  Visual fields grossly intact by confrontation.  Neck: Neck supple.  Pulmonary/Chest: Effort normal.    Abdominal: There is no tenderness.  Musculoskeletal: He exhibits no edema.  Neurological: He is alert.  Skin: Skin is warm. Capillary refill takes less than 2 seconds.  Psychiatric: He has a normal mood and affect.     ED Treatments / Results  Labs (all labs ordered are listed, but only abnormal results are displayed) Labs Reviewed - No data to display  EKG None  Radiology No results found.  Procedures Procedures (including critical care time)  Medications Ordered in ED Medications - No data to display   Initial Impression / Assessment and Plan / ED Course  I have reviewed the triage vital signs and the nursing notes.  Pertinent labs & imaging results that were available during my care of the patient were reviewed by me and considered in my medical decision making (see chart for details).     Patient with floaters versus flashers in the right eye laterally.  Previous ocular migraine but this once different per patient.  Rather benign exam.  Did have some flashing at times.  Discussed with Dr. Cathey EndowBowen, patient can follow-up with Dr. Lorin PicketScott, his primary ophthalmologist hopefully tomorrow.  He will call on-call doctor today about getting an tomorrow.  Patient cannot follow with his doctor can follow with Dr. Cathey EndowBowen in the clinic.  Also if patient has worsening of symptoms can return to the hospital.  Final Clinical Impressions(s) / ED Diagnoses   Final diagnoses:  Floaters in visual field, right    ED Discharge Orders    None       Benjiman CorePickering, Macio Kissoon, MD 07/14/18 [phone]

## 2018-07-15 DIAGNOSIS — H43811 Vitreous degeneration, right eye: Secondary | ICD-10-CM | POA: Diagnosis not present

## 2018-07-30 DIAGNOSIS — H43811 Vitreous degeneration, right eye: Secondary | ICD-10-CM | POA: Diagnosis not present

## 2018-08-19 ENCOUNTER — Ambulatory Visit (INDEPENDENT_AMBULATORY_CARE_PROVIDER_SITE_OTHER)
Admission: RE | Admit: 2018-08-19 | Discharge: 2018-08-19 | Disposition: A | Discharge status: Home / Self Care | Payer: BLUE CROSS/BLUE SHIELD | Source: Ambulatory Visit | Attending: Internal Medicine | Admitting: Internal Medicine

## 2018-08-19 DIAGNOSIS — R911 Solitary pulmonary nodule: Secondary | ICD-10-CM | POA: Diagnosis not present

## 2018-08-19 DIAGNOSIS — R918 Other nonspecific abnormal finding of lung field: Secondary | ICD-10-CM | POA: Diagnosis not present

## 2018-08-26 DIAGNOSIS — Z87891 Personal history of nicotine dependence: Secondary | ICD-10-CM | POA: Diagnosis not present

## 2018-08-26 DIAGNOSIS — H9192 Unspecified hearing loss, left ear: Secondary | ICD-10-CM | POA: Diagnosis not present

## 2018-08-26 DIAGNOSIS — J343 Hypertrophy of nasal turbinates: Secondary | ICD-10-CM | POA: Diagnosis not present

## 2018-08-26 DIAGNOSIS — H9312 Tinnitus, left ear: Secondary | ICD-10-CM | POA: Diagnosis not present

## 2018-08-26 DIAGNOSIS — Z7289 Other problems related to lifestyle: Secondary | ICD-10-CM | POA: Diagnosis not present

## 2018-08-26 DIAGNOSIS — H9042 Sensorineural hearing loss, unilateral, left ear, with unrestricted hearing on the contralateral side: Secondary | ICD-10-CM | POA: Diagnosis not present

## 2018-08-27 ENCOUNTER — Ambulatory Visit (INDEPENDENT_AMBULATORY_CARE_PROVIDER_SITE_OTHER): Payer: BLUE CROSS/BLUE SHIELD | Admitting: Internal Medicine

## 2018-08-27 ENCOUNTER — Encounter: Payer: Self-pay | Admitting: Internal Medicine

## 2018-08-27 VITALS — BP 128/72 | HR 67 | Ht 71.0 in | Wt 232.8 lb

## 2018-08-27 DIAGNOSIS — R911 Solitary pulmonary nodule: Secondary | ICD-10-CM

## 2018-08-27 DIAGNOSIS — I10 Essential (primary) hypertension: Secondary | ICD-10-CM

## 2018-08-27 DIAGNOSIS — I700 Atherosclerosis of aorta: Secondary | ICD-10-CM | POA: Diagnosis not present

## 2018-08-27 DIAGNOSIS — E785 Hyperlipidemia, unspecified: Secondary | ICD-10-CM | POA: Diagnosis not present

## 2018-08-27 NOTE — Patient Instructions (Signed)
Your physician wants you to follow-up in: 12 months with Dr. Hilty. You will receive a reminder letter in the mail two months in advance. If you don't receive a letter, please call our office to schedule the follow-up appointment.  

## 2018-08-27 NOTE — Progress Notes (Signed)
OFFICE NOTE  Chief Complaint:  Routine follow-up  Primary Care Physician: Roderick Pee, MD  HPI:  William Rosales is a 57 y.o. male  who is referred to me for evaluation of possible coronary disease. Mr. Holleran is generally asymptomatic although occasionally gets a cool sensation across his chest when sitting up from laying down. It is not described as a pain or palpitations. He does not feel that when he exercises or is active or lifts heavy objects. His concern is started recently with his brother who had quadruple bypass at age 20. Before that he did not have any family history of coronary disease which was known. His parents had some hypertension and coronary risk factors. He has hypertension and dyslipidemia and hypothyroidism. He is concerned about the possibility of him having coronary artery disease and wanted to "get checked out". He was recently started on Lipitor for dyslipidemia and an LDL of 127.  06/05/2016  Mr. Delamar this returns today for follow-up. I'm pleased to report his coronary artery calcium score was 0. However there was a small nodule noted on his CT scan. This will require a repeat CT scan in one year which I ordered today. We also discussed medications for his cholesterol. In reviewing his LDL cholesterol most recently was 127 with total cholesterol of 199. This was prior to starting low-dose Lipitor 10 mg daily. I would submit that his goal cholesterol should be less than 100 for LDL and he may not achieve this goal on 10 mg. He scheduled to have a repeat lipid profile in a few months and may need up titration of his medication. He is also working on weight loss and diet changes which could be additionally helpful.  06/13/2017  Mr. Mccarney was seen today in follow-up. He continues to be asymptomatic. As briefly mentioned had a coronary calcium score of 0. He's on low-dose Lipitor 10 mg daily with an excellent lipid profile. He is at goal LDL less than 100. His  repeat CT scan showed stable pulmonary nodules and some mild emphysematous changes. He reports he was a smoker but quit many years ago.  08/27/2018  Mr. [place] is seen today in follow-up.  He seems to be doing well and is without complaints.  He recently underwent a repeat chest CT which showed stable subcentimeter pulmonary nodules.  No further follow-up is recommended.  He does have aortic atherosclerosis and although his calcium score was 0 we have recommended statin therapy with a goal LDL less than 100.   PMHx:  Past Medical History:  Diagnosis Date  . ADD (attention deficit disorder with hyperactivity)   . Hypertension   . Hypothyroidism     Past Surgical History:  Procedure Laterality Date  . SCALP LESION REMOVAL W/ FLAP AND SKIN GRAFT     reports it was benign    FAMHx:  Family History  Problem Relation Age of Onset  . Cancer Maternal Uncle   . Hypertension Other     SOCHx:   reports that he quit smoking about 22 years ago. His smoking use included cigarettes. He has a 18.00 pack-year smoking history. He has never used smokeless tobacco. He reports that he drinks about 12.0 standard drinks of alcohol per week. He reports that he does not use drugs.  ALLERGIES:  No Known Allergies  ROS: Pertinent items noted in HPI and remainder of comprehensive ROS otherwise negative.  HOME MEDS: Current Outpatient Medications  Medication Sig Dispense Refill  . aspirin  81 MG tablet Take 81 mg by mouth daily.      Marland Kitchen. atorvastatin (LIPITOR) 10 MG tablet Take 1 tablet (10 mg total) by mouth daily. 90 tablet 4  . Coenzyme Q10 (CO Q 10 PO) Take 1 capsule by mouth daily.     Marland Kitchen. levothyroxine (SYNTHROID, LEVOTHROID) 75 MCG tablet TAKE 1 TABLET BY MOUTH EVERY DAY **NNEDS OV** 100 tablet 4  . lisinopril (PRINIVIL,ZESTRIL) 30 MG tablet TAKE 1 TABLET BY MOUTH EVERY DAY 90 tablet 3   No current facility-administered medications for this visit.     LABS/IMAGING: No results found for this or  any previous visit (from the past 48 hour(s)). No results found.  WEIGHTS: Wt Readings from Last 3 Encounters:  08/27/18 232 lb 12.8 oz (105.6 kg)  07/14/18 235 lb 0.2 oz (106.6 kg)  05/02/18 235 lb (106.6 kg)    VITALS: BP 128/72   Pulse 67   Ht 5\' 11"  (1.803 m)   Wt 232 lb 12.8 oz (105.6 kg)   BMI 32.47 kg/m   EXAM: General appearance: alert and no distress Neck: no carotid bruit, no JVD and thyroid not enlarged, symmetric, no tenderness/mass/nodules Lungs: clear to auscultation bilaterally Heart: regular rate and rhythm, S1, S2 normal, no murmur, click, rub or gallop Abdomen: soft, non-tender; bowel sounds normal; no masses,  no organomegaly Extremities: extremities normal, atraumatic, no cyanosis or edema Pulses: 2+ and symmetric Skin: Skin color, texture, turgor normal. No rashes or lesions Neurologic: GroPsych: Pleasanty normal Psych: Pleasant  EKG: Normal sinus rhythm 67-personally reviewed  ASSESSMENT: 1. Zero coronary artery calcium score (05/2016) 2. Dyslipidemia - goal LDL <100 3. Hypertension 4. Family history of premature coronary artery disease 5. Hypothyroidism 6. Stable subcentimeter pulmonary nodules - benign/stable by imaging  PLAN: 1.   Mr. Epimenio SarinCruthis seems to be doing well although recently his LDL cholesterol has increased.  Although he is at goal less than 100, this likely represents weight gain.  He has had several vacations this summer and some dietary noncompliance.  I have encouraged him to continue to work on weight loss and dietary changes.  No adjustments were made in his medicines today.  I recommend a repeat lipid profile in 6 months.  Blood pressure is well controlled.  He does not need any further monitoring of his subcentimeter pulmonary nodules.   Plan follow-up with me annually or sooner as necessary.  Chrystie NoseKenneth C. Hilty, MD, [facility] Pr[facility] HospitalFACC, FACP  [place]  [facility]  Medical Director of the Advanced Lipid Disorders &  Cardiovascular Risk  Reduction Clinic Diplomate of the American Board of Clinical Lipidology Attending Cardiologist  Direct Dial: [phone]  Fax: [phone]  Website:  [email or web address]  Lisette AbuKenneth C Hilty 08/27/2018, 2:08 PM

## 2018-09-25 DIAGNOSIS — H524 Presbyopia: Secondary | ICD-10-CM | POA: Diagnosis not present

## 2018-09-25 DIAGNOSIS — H43811 Vitreous degeneration, right eye: Secondary | ICD-10-CM | POA: Diagnosis not present

## 2018-09-29 ENCOUNTER — Other Ambulatory Visit: Payer: Self-pay | Admitting: Family Medicine

## 2018-10-01 ENCOUNTER — Other Ambulatory Visit (INDEPENDENT_AMBULATORY_CARE_PROVIDER_SITE_OTHER): Payer: BLUE CROSS/BLUE SHIELD

## 2018-10-01 DIAGNOSIS — E039 Hypothyroidism, unspecified: Secondary | ICD-10-CM | POA: Diagnosis not present

## 2018-10-01 LAB — TSH: TSH: 3.33 u[IU]/mL (ref 0.35–4.50)

## 2018-10-01 NOTE — Telephone Encounter (Signed)
Pt was told early by phone that he would need to wait for labs to see if Rx has to change. Pt will be notified Rx will be sent after labs are reviewed!

## 2018-10-01 NOTE — Telephone Encounter (Signed)
Pt came in to have lab work done and stated that he is completely out of his TSH medication (levothyroxine)  Pharm :  [facility] [address]

## 2018-10-01 NOTE — Telephone Encounter (Signed)
Spoke to pt and informed him about TSH Rx. Pt was told via Dr.Todd's notes to have TSH rechecked in 09/2018. Pt verbalized understanding and will come in today for lab work.

## 2018-10-02 ENCOUNTER — Other Ambulatory Visit: Payer: Self-pay

## 2018-10-02 NOTE — Telephone Encounter (Signed)
Pt called provider line to ask about his medication refill. He is very upset that this has not been taken care of yet. I advised him Dr Tawanna Cooler is out of the office this week and we are waiting on his lab results to be reviewed before his rx can be refilled. He is not very pleasant on the phone. I have asked him to not use the provider line in the future and to please call our main office line going forward.   Enrique Sack - Please see if another provider can review labs in Dr. Nelida Meuse absence. Thanks!

## 2019-01-23 ENCOUNTER — Telehealth: Payer: Self-pay | Admitting: Family Medicine

## 2019-01-23 ENCOUNTER — Telehealth: Payer: Self-pay

## 2019-01-23 NOTE — Telephone Encounter (Signed)
Pt came in to office to ask about having a TOC appt. He states Kandee Keen has agreed to see his wife and she was able to schedule TOC, so he would like to be scheduled as well. He is also out of his medication, he does have refills but since Todd's DEA number is no longer valid he cannot fill them. He will be out of medication on Saturday.   Pt would like call back ASAP on TOC and med refill.

## 2019-01-23 NOTE — Telephone Encounter (Signed)
Left VM; instructed to CB, once TOC appt secured, refill will be sent

## 2019-01-23 NOTE — Telephone Encounter (Signed)
Copied from CRM [id number]. Topic: Quick Communication - Rx Refill/Question >> Jan 23, 2019  3:38 PM Mickel Baas B, [place] wrote: **Patient currently out of this medication. States that he does not have anymore refills. (confirmed with pharmacy). Advised that he would need a TOC appointment. Is going to call back to schedule. Would like to know if a refill could be authorized since he is out of the medication?**  Medication: lisinopril (PRINIVIL,ZESTRIL) 30 MG tablet  Has the patient contacted their pharmacy? Yes.   (Agent: If no, request that the patient contact the pharmacy for the refill.) (Agent: If yes, when and what did the pharmacy advise?)  Preferred Pharmacy (with phone number or street name): [id number] [place], [place] - [address]  Agent: Please be advised that RX refills may take up to 3 business days. We ask that you follow-up with your pharmacy.

## 2019-01-24 ENCOUNTER — Other Ambulatory Visit: Payer: Self-pay | Admitting: Adult Health

## 2019-01-24 MED ORDER — ATORVASTATIN CALCIUM 10 MG PO TABS: 10.0000 mg | ORAL_TABLET | Freq: Every day | ORAL | 0 refills | Status: DC

## 2019-01-24 MED ORDER — LEVOTHYROXINE SODIUM 75 MCG PO TABS: ORAL_TABLET | ORAL | 0 refills | Status: DC

## 2019-01-24 MED ORDER — LISINOPRIL 30 MG PO TABS: 30.0000 mg | ORAL_TABLET | Freq: Every day | ORAL | 0 refills | Status: DC

## 2019-01-24 NOTE — Telephone Encounter (Signed)
He can TOC.   I have sent in his medications for 90 days.

## 2019-01-28 NOTE — Telephone Encounter (Signed)
Appt scheduled and pt has meds.

## 2019-02-27 ENCOUNTER — Other Ambulatory Visit: Payer: Self-pay

## 2019-02-27 ENCOUNTER — Encounter: Payer: Self-pay | Admitting: Adult Health

## 2019-02-27 ENCOUNTER — Ambulatory Visit (INDEPENDENT_AMBULATORY_CARE_PROVIDER_SITE_OTHER): Payer: BLUE CROSS/BLUE SHIELD | Admitting: Adult Health

## 2019-02-27 DIAGNOSIS — E039 Hypothyroidism, unspecified: Secondary | ICD-10-CM | POA: Diagnosis not present

## 2019-02-27 DIAGNOSIS — I10 Essential (primary) hypertension: Secondary | ICD-10-CM

## 2019-02-27 DIAGNOSIS — E785 Hyperlipidemia, unspecified: Secondary | ICD-10-CM

## 2019-02-27 DIAGNOSIS — Z7689 Persons encountering health services in other specified circumstances: Secondary | ICD-10-CM | POA: Diagnosis not present

## 2019-02-27 NOTE — Progress Notes (Signed)
I connected with William Rosales on 02/27/19 at  7:30 AM EDT by a video enabled telemedicine application and verified that I am speaking with the correct person using two identifiers.  Location patient: home Location provider:work or home office Persons participating in the virtual visit: patient, provider  I discussed the limitations of evaluation and management by telemedicine and the availability of in person appointments. The patient expressed understanding and agreed to proceed.  Patient presents to clinic today to establish care. Pleasant 58 year old male who  has a past medical history of ADD (attention deficit disorder with hyperactivity), Dyslipidemia, Hypertension, and Hypothyroidism.  Former patient of Dr. Tawanna Cooler who last had a CPE on 04/2018   Acute Concerns: Establish Care  Chronic Issues: Hypothyroidism -controlled with Synthroid 75 mcg daily.  Lab Results  Component Value Date   TSH 3.33 10/01/2018   Essential Hypertension -lisinopril 30 mg daily.  He denies headaches, blurred vision,chest pain, or shortness of breath. He does get rare instances on dizziness especially when he bends over.  He does monitor his BP at home and reports readings in the 120/70's.   BP Readings from Last 3 Encounters:  08/27/18 128/72  07/14/18 113/77  05/02/18 126/86   Dyslipidemia -currently on low-dose Lipitor 10 mg and daily baby aspirin.  Has been seen by cardiology in the past, most recent calcium score = 0. He is seen by Cardiology once a year due to family history of cardiac disease   Pulmonary Nodule - stable on repeat CT   Health Maintenance: Dental -- Routine Care - Dr. Elwyn Reach  Vision -- Routine Care - Dr. Ronney Asters.  Immunizations -- UTD Colonoscopy -- UTD - due in November 2020   Past Medical History:  Diagnosis Date  . ADD (attention deficit disorder with hyperactivity)   . Dyslipidemia   . Hypertension   . Hypothyroidism     Past Surgical History:  Procedure  Laterality Date  . PRE-MALIGNANT / BENIGN SKIN LESION EXCISION     abdomen     Current Outpatient Medications on File Prior to Visit  Medication Sig Dispense Refill  . aspirin 81 MG tablet Take 81 mg by mouth daily.      Marland Kitchen atorvastatin (LIPITOR) 10 MG tablet Take 1 tablet (10 mg total) by mouth daily. 90 tablet 0  . Coenzyme Q10 (CO Q 10 PO) Take 1 capsule by mouth daily.     Marland Kitchen levothyroxine (SYNTHROID, LEVOTHROID) 75 MCG tablet TAKE 1 TABLET BY MOUTH EVERY DAY **NEEDS OV** 90 tablet 0  . lisinopril (PRINIVIL,ZESTRIL) 30 MG tablet Take 1 tablet (30 mg total) by mouth daily. 90 tablet 0   No current facility-administered medications on file prior to visit.     No Known Allergies  Family History  Problem Relation Age of Onset  . Colon polyps Father   . Cancer Maternal Uncle   . Hypertension Other     Social History   Socioeconomic History  . Marital status: Married    Spouse name: Not on file  . Number of children: Not on file  . Years of education: Not on file  . Highest education level: Not on file  Occupational History  . Occupation: Pensions consultant  Social Needs  . Financial resource strain: Not on file  . Food insecurity:    Worry: Not on file    Inability: Not on file  . Transportation needs:    Medical: Not on file    Non-medical: Not on file  Tobacco Use  . Smoking status: Former Smoker    Packs/day: 1.00    Years: 18.00    Pack years: 18.00    Types: Cigarettes    Last attempt to quit: 02/21/1996    Years since quitting: 23.0  . Smokeless tobacco: Never Used  Substance and Sexual Activity  . Alcohol use: Yes    Alcohol/week: 12.0 standard drinks    Types: 8 Glasses of wine, 4 Cans of beer per week  . Drug use: No  . Sexual activity: Not on file  Lifestyle  . Physical activity:    Days per week: Not on file    Minutes per session: Not on file  . Stress: Not on file  Relationships  . Social connections:    Talks on phone: Not on file     Gets together: Not on file    Attends religious service: Not on file    Active member of club or organization: Not on file    Attends meetings of clubs or organizations: Not on file    Relationship status: Not on file  . Intimate partner violence:    Fear of current or ex partner: Not on file    Emotionally abused: Not on file    Physically abused: Not on file    Forced sexual activity: Not on file  Other Topics Concern  . Not on file  Social History Narrative  . Not on file    Review of Systems  HENT: Negative.   Eyes: Negative.   Respiratory: Negative.   Cardiovascular: Negative.   Gastrointestinal: Negative.   Genitourinary: Negative.   Skin: Negative.   Neurological: Negative.   Endo/Heme/Allergies: Negative.   Psychiatric/Behavioral: Negative.   All other systems reviewed and are negative.    There were no vitals taken for this visit.  Physical Exam   VITALS per patient if applicable:  GENERAL: alert, oriented, appears well and in no acute distress  HEENT: atraumatic, conjunttiva clear, no obvious abnormalities on inspection of external nose and ears  NECK: normal movements of the head and neck  LUNGS: on inspection no signs of respiratory distress, breathing rate appears normal, no obvious gross SOB, gasping or wheezing  CV: no obvious cyanosis  MS: moves all visible extremities without noticeable abnormality  PSYCH/NEURO: pleasant and cooperative, no obvious depression or anxiety, speech and thought processing grossly intact   Assessment/Plan:  Discussed the following assessment and plan:  Essential hypertension  Encounter to establish care  Hypothyroidism, unspecified type  Dyslipidemia   I discussed the assessment and treatment plan with the patient. The patient was provided an opportunity to ask questions and all were answered. The patient agreed with the plan and demonstrated an understanding of the instructions.   The patient was advised  to call back or seek an in-person evaluation if the symptoms worsen or if the condition fails to improve as anticipated.  I provided 30 minutes of non-face-to-face time during this encounter.   Shirline Frees, NP

## 2019-04-20 ENCOUNTER — Other Ambulatory Visit: Payer: Self-pay | Admitting: Adult Health

## 2019-04-21 NOTE — Telephone Encounter (Signed)
Sent to the pharmacy by e-scribe. 

## 2019-06-30 ENCOUNTER — Telehealth: Payer: Self-pay | Admitting: Adult Health

## 2019-06-30 NOTE — Telephone Encounter (Signed)
Medication: levothyroxine (SYNTHROID, LEVOTHROID) 75 MCG tablet     Patient is requesting refill of this medication.    [facility]  [id number] [place] [phone] (Phone) [phone] (Fax)

## 2019-07-01 MED ORDER — LEVOTHYROXINE SODIUM 75 MCG PO TABS: ORAL_TABLET | ORAL | 0 refills | Status: DC

## 2019-07-01 NOTE — Telephone Encounter (Signed)
Sent to the pharmacy by e-scribe for 90 days.  Last TSH 09/2018.

## 2019-07-21 ENCOUNTER — Other Ambulatory Visit: Payer: Self-pay | Admitting: Adult Health

## 2019-07-23 NOTE — Telephone Encounter (Signed)
Sent to the pharmacy by e-scribe for 90 days.  Pt has upcoming cpx on 08/08/2019

## 2019-08-08 ENCOUNTER — Encounter: Payer: Self-pay | Admitting: Adult Health

## 2019-08-08 ENCOUNTER — Ambulatory Visit (INDEPENDENT_AMBULATORY_CARE_PROVIDER_SITE_OTHER): Payer: BC Managed Care – PPO | Admitting: Adult Health

## 2019-08-08 ENCOUNTER — Other Ambulatory Visit: Payer: Self-pay

## 2019-08-08 VITALS — BP 120/82 | Temp 97.6°F | Ht 71.25 in | Wt 245.0 lb

## 2019-08-08 DIAGNOSIS — Z23 Encounter for immunization: Secondary | ICD-10-CM

## 2019-08-08 DIAGNOSIS — I10 Essential (primary) hypertension: Secondary | ICD-10-CM | POA: Diagnosis not present

## 2019-08-08 DIAGNOSIS — Z1159 Encounter for screening for other viral diseases: Secondary | ICD-10-CM

## 2019-08-08 DIAGNOSIS — E785 Hyperlipidemia, unspecified: Secondary | ICD-10-CM | POA: Diagnosis not present

## 2019-08-08 DIAGNOSIS — E039 Hypothyroidism, unspecified: Secondary | ICD-10-CM | POA: Diagnosis not present

## 2019-08-08 DIAGNOSIS — Z0000 Encounter for general adult medical examination without abnormal findings: Secondary | ICD-10-CM

## 2019-08-08 LAB — LIPID PANEL
Cholesterol: 151 mg/dL (ref 0–200)
HDL: 50 mg/dL (ref >39.00)
LDL Cholesterol: 81 mg/dL (ref 0–99)
NonHDL: 100.53
Total CHOL/HDL Ratio: 3
Triglycerides: 99 mg/dL (ref 0.0–149.0)
VLDL: 19.8 mg/dL (ref 0.0–40.0)

## 2019-08-08 LAB — COMPREHENSIVE METABOLIC PANEL
ALT: 26 U/L (ref 0–53)
AST: 19 U/L (ref 0–37)
Albumin: 4.6 g/dL (ref 3.5–5.2)
Alkaline Phosphatase: 55 U/L (ref 39–117)
BUN: 23 mg/dL (ref 6–23)
CO2: 30 mEq/L (ref 19–32)
Calcium: 10 mg/dL (ref 8.4–10.5)
Chloride: 102 mEq/L (ref 96–112)
Creatinine, Ser: 1.05 mg/dL (ref 0.40–1.50)
GFR: 72.69 mL/min (ref >60.00)
Glucose, Bld: 120 mg/dL — ABNORMAL HIGH (ref 70–99)
Potassium: 4.7 mEq/L (ref 3.5–5.1)
Sodium: 139 mEq/L (ref 135–145)
Total Bilirubin: 1.6 mg/dL — ABNORMAL HIGH (ref 0.2–1.2)
Total Protein: 7.2 g/dL (ref 6.0–8.3)

## 2019-08-08 LAB — PSA: PSA: 1.77 ng/mL (ref 0.10–4.00)

## 2019-08-08 LAB — CBC WITH DIFFERENTIAL/PLATELET
Basophils Absolute: 0 10*3/uL (ref 0.0–0.1)
Basophils Relative: 0.3 % (ref 0.0–3.0)
Eosinophils Absolute: 0.2 10*3/uL (ref 0.0–0.7)
Eosinophils Relative: 3.4 % (ref 0.0–5.0)
HCT: 43.3 % (ref 39.0–52.0)
Hemoglobin: 14.7 g/dL (ref 13.0–17.0)
Lymphocytes Relative: 28.5 % (ref 12.0–46.0)
Lymphs Abs: 1.8 10*3/uL (ref 0.7–4.0)
MCHC: 34 g/dL (ref 30.0–36.0)
MCV: 87.3 fl (ref 78.0–100.0)
Monocytes Absolute: 0.7 10*3/uL (ref 0.1–1.0)
Monocytes Relative: 11.1 % (ref 3.0–12.0)
Neutro Abs: 3.6 10*3/uL (ref 1.4–7.7)
Neutrophils Relative %: 56.7 % (ref 43.0–77.0)
Platelets: 223 10*3/uL (ref 150.0–400.0)
RBC: 4.96 Mil/uL (ref 4.22–5.81)
RDW: 12.8 % (ref 11.5–15.5)
WBC: 6.3 10*3/uL (ref 4.0–10.5)

## 2019-08-08 LAB — TSH: TSH: 3.39 u[IU]/mL (ref 0.35–4.50)

## 2019-08-08 NOTE — Progress Notes (Signed)
Subjective:    Patient ID: William Rosales, male    DOB: Mar 31, 1961, 58 y.o.   MRN: [id number]  HPI  Patient presents for yearly preventative medicine examination. He is a pleasant 58 year old male who  has a past medical history of ADD (attention deficit disorder with hyperactivity), Dyslipidemia, Hypertension, and Hypothyroidism.  Hypothyroidism - takes Synthroid 75 mcg. Feels well controlled.   Essential Hypertension - Takes lisinopril 30 mg daily.  He denies headaches, blurred vision, chest pain, shortness of breath, or lightheadedness.  He does have a rare instance of dizziness when he bends over.  He does monitor his blood pressure at home and reports readings in the 120s over 70s. BP Readings from Last 3 Encounters:  08/27/18 128/72  07/14/18 113/77  05/02/18 126/86   Dyslipidemia -currently on low-dose Lipitor 10 mg and 81 mg aspirin.  He has been seen by cardiology in the past and his most recent calcium score was 0.  He is monitored by cardiology once a year due to family history of cardiac disease  H/o Pulmonary Nodule -most recent chest CT was September 2019 at which time nodules were unchanged and were compatible with a benign etiology  All immunizations and health maintenance protocols were reviewed with the patient and needed orders were placed.  Appropriate screening laboratory values were ordered for the patient including screening of hyperlipidemia, renal function and hepatic function. If indicated by BPH, a PSA was ordered.  Medication reconciliation,  past medical history, social history, problem list and allergies were reviewed in detail with the patient  Goals were established with regard to weight loss, exercise, and  diet in compliance with medications. He has not been exercising or dieting during the Covid pandemic.   Wt Readings from Last 3 Encounters:  08/08/19 245 lb (111.1 kg)  08/27/18 232 lb 12.8 oz (105.6 kg)  07/14/18 235 lb 0.2 oz (106.6 kg)   End of life planning was discussed.  He is up-to-date on routine dental and vision screens.  He is due for repeat colonoscopy in 2 months  Review of Systems  Constitutional: Negative.   HENT: Negative.   Eyes: Negative.   Respiratory: Negative.   Cardiovascular: Negative.   Gastrointestinal: Negative.   Endocrine: Negative.   Genitourinary: Negative.   Musculoskeletal: Negative.   Skin: Negative.   Allergic/Immunologic: Negative.   Neurological: Negative.   Hematological: Negative.   Psychiatric/Behavioral: Negative.   All other systems reviewed and are negative.  Past Medical History:  Diagnosis Date  . ADD (attention deficit disorder with hyperactivity)   . Dyslipidemia   . Hypertension   . Hypothyroidism     Social History   Socioeconomic History  . Marital status: Married    Spouse name: Not on file  . Number of children: Not on file  . Years of education: Not on file  . Highest education level: Not on file  Occupational History  . Occupation: Neurosurgeon  Social Needs  . Financial resource strain: Not on file  . Food insecurity    Worry: Not on file    Inability: Not on file  . Transportation needs    Medical: Not on file    Non-medical: Not on file  Tobacco Use  . Smoking status: Former Smoker    Packs/day: 1.00    Years: 18.00    Pack years: 18.00    Types: Cigarettes    Quit date: 02/21/1996    Years since quitting: 23.4  . Smokeless  tobacco: Never Used  Substance and Sexual Activity  . Alcohol use: Yes    Alcohol/week: 12.0 standard drinks    Types: 8 Glasses of wine, 4 Cans of beer per week  . Drug use: No  . Sexual activity: Not on file  Lifestyle  . Physical activity    Days per week: Not on file    Minutes per session: Not on file  . Stress: Not on file  Relationships  . Social Musicianconnections    Talks on phone: Not on file    Gets together: Not on file    Attends religious service: Not on file    Active member of club or  organization: Not on file    Attends meetings of clubs or organizations: Not on file    Relationship status: Not on file  . Intimate partner violence    Fear of current or ex partner: Not on file    Emotionally abused: Not on file    Physically abused: Not on file    Forced sexual activity: Not on file  Other Topics Concern  . Not on file  Social History Narrative  . Not on file    Past Surgical History:  Procedure Laterality Date  . PRE-MALIGNANT / BENIGN SKIN LESION EXCISION     abdomen     Family History  Problem Relation Age of Onset  . Colon polyps Father   . Cancer Maternal Uncle   . Hypertension Other     No Known Allergies  Current Outpatient Medications on File Prior to Visit  Medication Sig Dispense Refill  . aspirin 81 MG tablet Take 81 mg by mouth daily.      Marland Kitchen. atorvastatin (LIPITOR) 10 MG tablet Take 1 tablet (10 mg total) by mouth daily. 90 tablet 0  . Coenzyme Q10 (CO Q 10 PO) Take 1 capsule by mouth daily.     Marland Kitchen. levothyroxine (SYNTHROID) 75 MCG tablet TAKE 1 TABLET BY MOUTH EVERY DAY 90 tablet 0  . lisinopril (ZESTRIL) 30 MG tablet TAKE 1 TABLET BY MOUTH EVERY DAY 90 tablet 0   No current facility-administered medications on file prior to visit.     There were no vitals taken for this visit.      Objective:   Physical Exam Vitals signs and nursing note reviewed.  Constitutional:      General: He is not in acute distress.    Appearance: Normal appearance. He is not diaphoretic.  HENT:     Head: Normocephalic and atraumatic.     Right Ear: Tympanic membrane, ear canal and external ear normal. There is no impacted cerumen.     Left Ear: Tympanic membrane, ear canal and external ear normal. There is no impacted cerumen.     Nose: Nose normal. No congestion or rhinorrhea.     Mouth/Throat:     Mouth: Mucous membranes are moist.     Pharynx: Oropharynx is clear. No oropharyngeal exudate.  Eyes:     General: No scleral icterus.       Right eye: No  discharge.        Left eye: No discharge.     Extraocular Movements: Extraocular movements intact.     Conjunctiva/sclera: Conjunctivae normal.     Pupils: Pupils are equal, round, and reactive to light.  Neck:     Musculoskeletal: Normal range of motion and neck supple.     Thyroid: No thyromegaly.     Vascular: No JVD.     Trachea:  No tracheal deviation.  Cardiovascular:     Rate and Rhythm: Normal rate and regular rhythm.     Pulses: Normal pulses.     Heart sounds: Normal heart sounds. No murmur. No friction rub. No gallop.   Pulmonary:     Effort: Pulmonary effort is normal. No respiratory distress.     Breath sounds: Normal breath sounds. No stridor. No wheezing, rhonchi or rales.  Chest:     Chest wall: No tenderness.  Abdominal:     General: Bowel sounds are normal. There is no distension.     Palpations: Abdomen is soft. There is no mass.     Tenderness: There is no abdominal tenderness. There is no right CVA tenderness, left CVA tenderness, guarding or rebound.     Hernia: No hernia is present.  Musculoskeletal: Normal range of motion.        General: No swelling, tenderness, deformity or signs of injury.     Right lower leg: No edema.     Left lower leg: No edema.  Lymphadenopathy:     Cervical: No cervical adenopathy.  Skin:    General: Skin is warm and dry.     Capillary Refill: Capillary refill takes less than 2 seconds.     Coloration: Skin is not jaundiced or pale.     Findings: No bruising, erythema, lesion or rash.  Neurological:     General: No focal deficit present.     Mental Status: He is alert and oriented to person, place, and time.     Cranial Nerves: No cranial nerve deficit.     Sensory: No sensory deficit.     Motor: No weakness or abnormal muscle tone.     Coordination: Coordination normal.     Gait: Gait normal.     Deep Tendon Reflexes: Reflexes are normal and symmetric. Reflexes normal.  Psychiatric:        Mood and Affect: Mood normal.         Behavior: Behavior normal.        Thought Content: Thought content normal.        Judgment: Judgment normal.        Assessment & Plan:  1. Routine general medical examination at a health care facility - Needs to work on weight loss through diet and exercise - Follow up in one year or sooner if needed - CBC with Differential/Platelet - Comprehensive metabolic panel - Lipid panel - PSA - TSH  2. Essential hypertension - Well controlled.  - No change in medications  - CBC with Differential/Platelet - Comprehensive metabolic panel - Lipid panel - PSA - TSH  3. Hypothyroidism, unspecified type - Consider increase in Synthroid  - CBC with Differential/Platelet - Comprehensive metabolic panel - Lipid panel - PSA - TSH  4. Dyslipidemia -Consider increase in statin  - CBC with Differential/Platelet - Comprehensive metabolic panel - Lipid panel - PSA - TSH  5. Need for hepatitis C screening test  - Hep C Antibody   Shirline Frees, NP

## 2019-08-08 NOTE — Patient Instructions (Signed)
It was great seeing you today!  We will follow up with you regarding your blood work   Please work on diet and exercise over the next year.   If you need anything, please let me know

## 2019-08-08 NOTE — Addendum Note (Signed)
Addended by: Miles Costain T on: 08/08/2019 11:22 AM   Modules accepted: Orders

## 2019-08-08 NOTE — Addendum Note (Signed)
Addended by: Elmer Picker on: 08/08/2019 01:03 PM   Modules accepted: Orders

## 2019-08-12 LAB — HEPATITIS C ANTIBODY
Hepatitis C Ab: NONREACTIVE
SIGNAL TO CUT-OFF: 0.03 (ref <1.00)

## 2019-08-12 LAB — HEMOGLOBIN A1C: Hgb A1c MFr Bld: 5.9 % (ref 4.6–6.5)

## 2019-09-18 DIAGNOSIS — H43811 Vitreous degeneration, right eye: Secondary | ICD-10-CM | POA: Diagnosis not present

## 2019-09-19 DIAGNOSIS — H43811 Vitreous degeneration, right eye: Secondary | ICD-10-CM | POA: Diagnosis not present

## 2019-09-29 ENCOUNTER — Other Ambulatory Visit: Payer: Self-pay | Admitting: Adult Health

## 2019-10-01 NOTE — Telephone Encounter (Signed)
Sent to the pharmacy by e-scribe. 

## 2019-10-02 DIAGNOSIS — H43813 Vitreous degeneration, bilateral: Secondary | ICD-10-CM | POA: Diagnosis not present

## 2019-10-08 ENCOUNTER — Other Ambulatory Visit: Payer: Self-pay

## 2019-10-08 ENCOUNTER — Encounter (INDEPENDENT_AMBULATORY_CARE_PROVIDER_SITE_OTHER): Payer: BC Managed Care – PPO | Admitting: Ophthalmology

## 2019-10-08 DIAGNOSIS — I10 Essential (primary) hypertension: Secondary | ICD-10-CM | POA: Diagnosis not present

## 2019-10-08 DIAGNOSIS — H35033 Hypertensive retinopathy, bilateral: Secondary | ICD-10-CM

## 2019-10-08 DIAGNOSIS — H33302 Unspecified retinal break, left eye: Secondary | ICD-10-CM | POA: Diagnosis not present

## 2019-10-08 DIAGNOSIS — H2513 Age-related nuclear cataract, bilateral: Secondary | ICD-10-CM

## 2019-10-08 DIAGNOSIS — H43813 Vitreous degeneration, bilateral: Secondary | ICD-10-CM

## 2019-10-15 ENCOUNTER — Other Ambulatory Visit: Payer: Self-pay

## 2019-10-15 ENCOUNTER — Other Ambulatory Visit: Payer: Self-pay | Admitting: Adult Health

## 2019-10-15 ENCOUNTER — Encounter (INDEPENDENT_AMBULATORY_CARE_PROVIDER_SITE_OTHER): Payer: BC Managed Care – PPO | Admitting: Ophthalmology

## 2019-10-15 DIAGNOSIS — H33302 Unspecified retinal break, left eye: Secondary | ICD-10-CM

## 2019-10-16 NOTE — Telephone Encounter (Signed)
Sent to the pharmacy by e-scribe. 

## 2019-10-21 DIAGNOSIS — Z20828 Contact with and (suspected) exposure to other viral communicable diseases: Secondary | ICD-10-CM | POA: Diagnosis not present

## 2019-10-21 DIAGNOSIS — J3489 Other specified disorders of nose and nasal sinuses: Secondary | ICD-10-CM | POA: Diagnosis not present

## 2019-10-22 DIAGNOSIS — Z20828 Contact with and (suspected) exposure to other viral communicable diseases: Secondary | ICD-10-CM | POA: Diagnosis not present

## 2019-10-22 DIAGNOSIS — J029 Acute pharyngitis, unspecified: Secondary | ICD-10-CM | POA: Diagnosis not present

## 2019-10-22 DIAGNOSIS — R070 Pain in throat: Secondary | ICD-10-CM | POA: Diagnosis not present

## 2019-10-26 ENCOUNTER — Encounter: Payer: Self-pay | Admitting: Internal Medicine

## 2019-10-28 ENCOUNTER — Encounter (INDEPENDENT_AMBULATORY_CARE_PROVIDER_SITE_OTHER): Payer: BC Managed Care – PPO | Admitting: Ophthalmology

## 2019-10-28 DIAGNOSIS — H33302 Unspecified retinal break, left eye: Secondary | ICD-10-CM | POA: Diagnosis not present

## 2019-10-28 DIAGNOSIS — H4312 Vitreous hemorrhage, left eye: Secondary | ICD-10-CM | POA: Diagnosis not present

## 2019-11-06 ENCOUNTER — Encounter (INDEPENDENT_AMBULATORY_CARE_PROVIDER_SITE_OTHER): Payer: BC Managed Care – PPO | Admitting: Ophthalmology

## 2019-11-07 ENCOUNTER — Encounter (INDEPENDENT_AMBULATORY_CARE_PROVIDER_SITE_OTHER): Payer: BC Managed Care – PPO | Admitting: Ophthalmology

## 2019-11-07 DIAGNOSIS — H4312 Vitreous hemorrhage, left eye: Secondary | ICD-10-CM

## 2019-11-07 DIAGNOSIS — H2513 Age-related nuclear cataract, bilateral: Secondary | ICD-10-CM

## 2019-11-07 DIAGNOSIS — H35033 Hypertensive retinopathy, bilateral: Secondary | ICD-10-CM | POA: Diagnosis not present

## 2019-11-07 DIAGNOSIS — H33302 Unspecified retinal break, left eye: Secondary | ICD-10-CM | POA: Diagnosis not present

## 2019-11-07 DIAGNOSIS — I10 Essential (primary) hypertension: Secondary | ICD-10-CM

## 2019-12-16 DIAGNOSIS — H524 Presbyopia: Secondary | ICD-10-CM | POA: Diagnosis not present

## 2019-12-24 ENCOUNTER — Telehealth: Payer: Self-pay

## 2019-12-24 MED ORDER — LEVOTHYROXINE SODIUM 75 MCG PO TABS: ORAL_TABLET | ORAL | 0 refills | Status: DC

## 2019-12-26 DIAGNOSIS — U071 COVID-19: Secondary | ICD-10-CM | POA: Diagnosis not present

## 2020-01-13 ENCOUNTER — Telehealth: Payer: BC Managed Care – PPO | Admitting: Adult Health

## 2020-01-13 DIAGNOSIS — R309 Painful micturition, unspecified: Secondary | ICD-10-CM | POA: Diagnosis not present

## 2020-01-13 DIAGNOSIS — N419 Inflammatory disease of prostate, unspecified: Secondary | ICD-10-CM | POA: Diagnosis not present

## 2020-01-14 NOTE — Telephone Encounter (Signed)
Erroneous encounter

## 2020-02-11 DIAGNOSIS — R350 Frequency of micturition: Secondary | ICD-10-CM | POA: Diagnosis not present

## 2020-02-11 DIAGNOSIS — R3912 Poor urinary stream: Secondary | ICD-10-CM | POA: Diagnosis not present

## 2020-02-11 DIAGNOSIS — R351 Nocturia: Secondary | ICD-10-CM | POA: Diagnosis not present

## 2020-02-11 DIAGNOSIS — R8271 Bacteriuria: Secondary | ICD-10-CM | POA: Diagnosis not present

## 2020-02-16 ENCOUNTER — Encounter (INDEPENDENT_AMBULATORY_CARE_PROVIDER_SITE_OTHER): Payer: BC Managed Care – PPO | Admitting: Ophthalmology

## 2020-02-16 DIAGNOSIS — H2513 Age-related nuclear cataract, bilateral: Secondary | ICD-10-CM

## 2020-02-16 DIAGNOSIS — H35033 Hypertensive retinopathy, bilateral: Secondary | ICD-10-CM

## 2020-02-16 DIAGNOSIS — H33302 Unspecified retinal break, left eye: Secondary | ICD-10-CM

## 2020-02-16 DIAGNOSIS — H4312 Vitreous hemorrhage, left eye: Secondary | ICD-10-CM

## 2020-02-16 DIAGNOSIS — I10 Essential (primary) hypertension: Secondary | ICD-10-CM | POA: Diagnosis not present

## 2020-02-16 DIAGNOSIS — H43813 Vitreous degeneration, bilateral: Secondary | ICD-10-CM | POA: Diagnosis not present

## 2020-03-18 ENCOUNTER — Other Ambulatory Visit: Payer: Self-pay | Admitting: Adult Health

## 2020-03-18 NOTE — Telephone Encounter (Signed)
SENT TO THE PHARMACY BY E-SCRIBE. 

## 2020-03-22 ENCOUNTER — Encounter: Payer: Self-pay | Admitting: Internal Medicine

## 2020-03-22 ENCOUNTER — Ambulatory Visit (INDEPENDENT_AMBULATORY_CARE_PROVIDER_SITE_OTHER): Payer: BLUE CROSS/BLUE SHIELD | Admitting: Internal Medicine

## 2020-03-22 ENCOUNTER — Other Ambulatory Visit: Payer: Self-pay

## 2020-03-22 VITALS — BP 124/76 | HR 67 | Temp 97.2°F | Ht 71.0 in | Wt 232.0 lb

## 2020-03-22 DIAGNOSIS — R911 Solitary pulmonary nodule: Secondary | ICD-10-CM

## 2020-03-22 DIAGNOSIS — I700 Atherosclerosis of aorta: Secondary | ICD-10-CM | POA: Diagnosis not present

## 2020-03-22 DIAGNOSIS — I10 Essential (primary) hypertension: Secondary | ICD-10-CM

## 2020-03-22 DIAGNOSIS — E785 Hyperlipidemia, unspecified: Secondary | ICD-10-CM | POA: Diagnosis not present

## 2020-03-22 NOTE — Progress Notes (Signed)
OFFICE NOTE  Chief Complaint:  Routine follow-up  Primary Care Physician: Dorothyann Peng, NP  HPI:  William Rosales is a 59 y.o. male  who is referred to me for evaluation of possible coronary disease. William Rosales is generally asymptomatic although occasionally gets a cool sensation across his chest when sitting up from laying down. It is not described as a pain or palpitations. He does not feel that when he exercises or is active or lifts heavy objects. His concern is started recently with his brother who had quadruple bypass at age 19. Before that he did not have any family history of coronary disease which was known. His parents had some hypertension and coronary risk factors. He has hypertension and dyslipidemia and hypothyroidism. He is concerned about the possibility of him having coronary artery disease and wanted to "get checked out". He was recently started on Lipitor for dyslipidemia and an LDL of 127.  06/05/2016  William Rosales this returns today for follow-up. I'm pleased to report his coronary artery calcium score was 0. However there was a small nodule noted on his CT scan. This will require a repeat CT scan in one year which I ordered today. We also discussed medications for his cholesterol. In reviewing his LDL cholesterol most recently was 127 with total cholesterol of 199. This was prior to starting low-dose Lipitor 10 mg daily. I would submit that his goal cholesterol should be less than 100 for LDL and he may not achieve this goal on 10 mg. He scheduled to have a repeat lipid profile in a few months and may need up titration of his medication. He is also working on weight loss and diet changes which could be additionally helpful.  06/13/2017  William Rosales was seen today in follow-up. He continues to be asymptomatic. As briefly mentioned had a coronary calcium score of 0. He's on low-dose Lipitor 10 mg daily with an excellent lipid profile. He is at goal LDL less than 100. His  repeat CT scan showed stable pulmonary nodules and some mild emphysematous changes. He reports he was a smoker but quit many years ago.  08/27/2018  William Rosales is seen today in follow-up.  He seems to be doing well and is without complaints.  He recently underwent a repeat chest CT which showed stable subcentimeter pulmonary nodules.  No further follow-up is recommended.  He does have aortic atherosclerosis and although his calcium score was 0 we have recommended statin therapy with a goal LDL less than 100.   03/22/2020  William Rosales is seen today in follow-up.  In general he seems to be doing fairly well.  He has no new complaints.  His wife is a Marine scientist and she inquired about whether he should have any further follow-up of subcentimeter pulmonary nodules.  Although the radiologist felt that the follow-up CT scan had not changed and further imaging was not necessary, it would be not unreasonable to consider repeating it.  His labs have shown improvement with further decrease in LDL cholesterol.  Total cholesterol is 151, triglycerides 99, HDL 50 and LDL of 81.  EKG today shows sinus rhythm with no ischemic changes.  PMHx:  Past Medical History:  Diagnosis Date  . ADD (attention deficit disorder with hyperactivity)   . Dyslipidemia   . Hypertension   . Hypothyroidism     Past Surgical History:  Procedure Laterality Date  . PRE-MALIGNANT / BENIGN SKIN LESION EXCISION     abdomen  FAMHx:  Family History  Problem Relation Age of Onset  . Colon polyps Father   . Cancer Maternal Uncle   . Hypertension Other     SOCHx:   reports that he quit smoking about 24 years ago. His smoking use included cigarettes. He has a 18.00 pack-year smoking history. He has never used smokeless tobacco. He reports current alcohol use of about 12.0 standard drinks of alcohol per week. He reports that he does not use drugs.  ALLERGIES:  No Known Allergies  ROS: Pertinent items noted in HPI and remainder  of comprehensive ROS otherwise negative.  HOME MEDS: Current Outpatient Medications  Medication Sig Dispense Refill  . aspirin 81 MG tablet Take 81 mg by mouth daily.      Marland Kitchen atorvastatin (LIPITOR) 10 MG tablet TAKE 1 TABLET BY MOUTH EVERY DAY 90 tablet 3  . Coenzyme Q10 100 MG capsule Take by mouth.    . levothyroxine (SYNTHROID) 75 MCG tablet TAKE 1 TABLET BY MOUTH EVERY DAY 90 tablet 1  . lisinopril (ZESTRIL) 30 MG tablet TAKE 1 TABLET BY MOUTH EVERY DAY 90 tablet 3  . tamsulosin (FLOMAX) 0.4 MG CAPS capsule Take 0.4 mg by mouth daily.     No current facility-administered medications for this visit.    LABS/IMAGING: No results found for this or any previous visit (from the past 48 hour(s)). No results found.  WEIGHTS: Wt Readings from Last 3 Encounters:  03/22/20 232 lb (105.2 kg)  08/08/19 245 lb (111.1 kg)  08/27/18 232 lb 12.8 oz (105.6 kg)    VITALS: BP 124/76   Pulse 67   Temp (!) 97.2 F (36.2 C)   Ht 5\' 11"  (1.803 m)   Wt 232 lb (105.2 kg)   SpO2 96%   BMI 32.36 kg/m   EXAM: General appearance: alert and no distress Neck: no carotid bruit, no JVD and thyroid not enlarged, symmetric, no tenderness/mass/nodules Lungs: clear to auscultation bilaterally Heart: regular rate and rhythm, S1, S2 normal, no murmur, click, rub or gallop Abdomen: soft, non-tender; bowel sounds normal; no masses,  no organomegaly Extremities: extremities normal, atraumatic, no cyanosis or edema Pulses: 2+ and symmetric Skin: Skin color, texture, turgor normal. No rashes or lesions Neurologic: GroPsych: Pleasanty normal Psych: Pleasant  EKG: Normal sinus rhythm at 67-personally reviewed  ASSESSMENT: 1. Zero coronary artery calcium score (05/2016) 2. Dyslipidemia - goal LDL <100 3. Hypertension 4. Family history of premature coronary artery disease 5. Hypothyroidism 6. Stable subcentimeter pulmonary nodules - benign/stable by imaging  PLAN: 1.   William Rosales continues to do well.   His cholesterol is working his way downward but remains below 100.  He did have several subcentimeter pulmonary nodules which were stable by imaging however he is concerned it is now been about 2 years since it was last assessed.  He would like a repeat low-dose scan to see if they are stable.  We will order that today.  Otherwise continue current medications.  Plan follow-up with me annually or sooner as necessary.  Epimenio Sarin, MD, [facility], FACP  [place]  [facility]  Medical Director of the Advanced Lipid Disorders &  Cardiovascular Risk Reduction Clinic Diplomate of the American Board of Clinical Lipidology Attending Cardiologist  Direct Dial: [phone]  Fax: [phone]  Website:  [email or web address] 03/22/2020, 3:43 PM

## 2020-03-22 NOTE — Patient Instructions (Signed)
Medication Instructions:  Your physician recommends that you continue on your current medications as directed. Please refer to the Current Medication list given to you today.  *If you need a refill on your cardiac medications before your next appointment, please call your pharmacy*  Testing/Procedures: Non-Contrast Chest CT for lung nodule follow up in September 2021   Follow-Up: At [facility], you and your health needs are our priority.  As part of our continuing mission to provide you with exceptional heart care, we have created designated Provider Care Teams.  These Care Teams include your primary Cardiologist (physician) and Advanced Practice Providers (APPs -  Physician Assistants and Nurse Practitioners) who all work together to provide you with the care you need, when you need it.  We recommend signing up for the patient portal called "MyChart".  Sign up information is provided on this After Visit Summary.  MyChart is used to connect with patients for Virtual Visits (Telemedicine).  Patients are able to view lab/test results, encounter notes, upcoming appointments, etc.  Non-urgent messages can be sent to your provider as well.   To learn more about what you can do with MyChart, go to [email or web address].    Your next appointment:   12 month(s)  The format for your next appointment:   In Person  Provider:   You may see Dr. Rennis Golden or one of the following Advanced Practice Providers on your designated Care Team:    Azalee Course, PA-C  Micah Flesher, [place] or   Judy Pimple, [place]    Other Instructions

## 2020-03-23 ENCOUNTER — Encounter: Payer: Self-pay | Admitting: Internal Medicine

## 2020-08-02 DIAGNOSIS — Z23 Encounter for immunization: Secondary | ICD-10-CM | POA: Diagnosis not present

## 2020-08-04 ENCOUNTER — Telehealth: Payer: Self-pay | Admitting: Internal Medicine

## 2020-08-04 ENCOUNTER — Encounter: Payer: Self-pay | Admitting: Adult Health

## 2020-08-04 NOTE — Telephone Encounter (Signed)
Spoke with patient regarding appointment for CT chest scheduled Monday 08/16/20 at 11:00am at [facility] CT---arrival time 10:30 am for check in---patient states he has had studies there before and is familiar with the location.

## 2020-08-11 DIAGNOSIS — I10 Essential (primary) hypertension: Secondary | ICD-10-CM | POA: Diagnosis not present

## 2020-08-11 DIAGNOSIS — E785 Hyperlipidemia, unspecified: Secondary | ICD-10-CM | POA: Diagnosis not present

## 2020-08-11 DIAGNOSIS — Z20822 Contact with and (suspected) exposure to covid-19: Secondary | ICD-10-CM | POA: Diagnosis not present

## 2020-08-11 DIAGNOSIS — J069 Acute upper respiratory infection, unspecified: Secondary | ICD-10-CM | POA: Diagnosis not present

## 2020-08-16 ENCOUNTER — Inpatient Hospital Stay: Admission: RE | Admit: 2020-08-16 | Payer: BLUE CROSS/BLUE SHIELD | Source: Ambulatory Visit

## 2020-08-16 DIAGNOSIS — R05 Cough: Secondary | ICD-10-CM | POA: Diagnosis not present

## 2020-08-18 ENCOUNTER — Other Ambulatory Visit: Payer: Self-pay

## 2020-08-18 ENCOUNTER — Encounter (INDEPENDENT_AMBULATORY_CARE_PROVIDER_SITE_OTHER): Payer: BLUE CROSS/BLUE SHIELD | Admitting: Ophthalmology

## 2020-08-18 DIAGNOSIS — I10 Essential (primary) hypertension: Secondary | ICD-10-CM

## 2020-08-18 DIAGNOSIS — H35033 Hypertensive retinopathy, bilateral: Secondary | ICD-10-CM

## 2020-08-18 DIAGNOSIS — H43813 Vitreous degeneration, bilateral: Secondary | ICD-10-CM

## 2020-08-18 DIAGNOSIS — H33302 Unspecified retinal break, left eye: Secondary | ICD-10-CM | POA: Diagnosis not present

## 2020-08-19 ENCOUNTER — Telehealth: Payer: Self-pay | Admitting: *Deleted

## 2020-08-19 NOTE — Telephone Encounter (Signed)
Spoke with patient regarding appointment for CT chest scheduled Tuesday 08/31/20 at [place] CT---arrival time 8:74m for check in---patient voiced his understanding.

## 2020-08-31 ENCOUNTER — Other Ambulatory Visit: Payer: Self-pay

## 2020-08-31 ENCOUNTER — Ambulatory Visit (INDEPENDENT_AMBULATORY_CARE_PROVIDER_SITE_OTHER)
Admission: RE | Admit: 2020-08-31 | Discharge: 2020-08-31 | Disposition: A | Discharge status: Home / Self Care | Payer: BLUE CROSS/BLUE SHIELD | Source: Ambulatory Visit | Attending: Internal Medicine | Admitting: Internal Medicine

## 2020-08-31 DIAGNOSIS — I700 Atherosclerosis of aorta: Secondary | ICD-10-CM | POA: Diagnosis not present

## 2020-08-31 DIAGNOSIS — R911 Solitary pulmonary nodule: Secondary | ICD-10-CM

## 2020-08-31 DIAGNOSIS — R918 Other nonspecific abnormal finding of lung field: Secondary | ICD-10-CM | POA: Diagnosis not present

## 2020-09-22 ENCOUNTER — Other Ambulatory Visit: Payer: Self-pay | Admitting: Adult Health

## 2020-09-22 NOTE — Telephone Encounter (Signed)
Do you want to see him in the near future for TSH follow up. Last one was 08/2019.

## 2020-09-22 NOTE — Telephone Encounter (Signed)
He is overdue for a CPE. Needs to schedule that and can refill until he is seen

## 2020-09-26 ENCOUNTER — Other Ambulatory Visit: Payer: Self-pay | Admitting: Adult Health

## 2020-10-05 ENCOUNTER — Other Ambulatory Visit: Payer: Self-pay | Admitting: Adult Health

## 2020-10-22 ENCOUNTER — Encounter: Payer: Self-pay | Admitting: Adult Health

## 2020-10-22 ENCOUNTER — Other Ambulatory Visit: Payer: Self-pay

## 2020-10-22 ENCOUNTER — Ambulatory Visit (INDEPENDENT_AMBULATORY_CARE_PROVIDER_SITE_OTHER): Payer: BLUE CROSS/BLUE SHIELD | Admitting: Adult Health

## 2020-10-22 ENCOUNTER — Other Ambulatory Visit: Payer: Self-pay | Admitting: Adult Health

## 2020-10-22 VITALS — BP 124/76 | HR 73 | Temp 97.7°F | Ht 71.0 in | Wt 262.8 lb

## 2020-10-22 DIAGNOSIS — N400 Enlarged prostate without lower urinary tract symptoms: Secondary | ICD-10-CM | POA: Diagnosis not present

## 2020-10-22 DIAGNOSIS — E039 Hypothyroidism, unspecified: Secondary | ICD-10-CM | POA: Diagnosis not present

## 2020-10-22 DIAGNOSIS — E785 Hyperlipidemia, unspecified: Secondary | ICD-10-CM

## 2020-10-22 DIAGNOSIS — I10 Essential (primary) hypertension: Secondary | ICD-10-CM | POA: Diagnosis not present

## 2020-10-22 DIAGNOSIS — Z0000 Encounter for general adult medical examination without abnormal findings: Secondary | ICD-10-CM | POA: Diagnosis not present

## 2020-10-22 DIAGNOSIS — Z23 Encounter for immunization: Secondary | ICD-10-CM | POA: Diagnosis not present

## 2020-10-22 MED ORDER — ATORVASTATIN CALCIUM 10 MG PO TABS: 10.0000 mg | ORAL_TABLET | Freq: Every day | ORAL | 3 refills | Status: DC

## 2020-10-22 MED ORDER — LEVOTHYROXINE SODIUM 75 MCG PO TABS: 75.0000 ug | ORAL_TABLET | Freq: Every day | ORAL | 3 refills | Status: DC

## 2020-10-22 MED ORDER — LISINOPRIL 30 MG PO TABS: 30.0000 mg | ORAL_TABLET | Freq: Every day | ORAL | 3 refills | Status: DC

## 2020-10-22 MED ORDER — TAMSULOSIN HCL 0.4 MG PO CAPS: 0.4000 mg | ORAL_CAPSULE | Freq: Every day | ORAL | 3 refills | Status: AC

## 2020-10-22 NOTE — Progress Notes (Signed)
Subjective:    Patient ID: William Rosales, male    DOB: 1961-01-30, 59 y.o.   MRN: [id number]  HPI Patient presents for yearly preventative medicine examination. He is a pleasant 59 year old male who  has a past medical history of ADD (attention deficit disorder with hyperactivity), Dyslipidemia, Hypertension, and Hypothyroidism.  Hypothyroidism -takes an thyroid 75 mcg daily.  He does feel well controlled on this dose  Essential hypertension-takes lisinopril 30 mg daily.  He does monitor his blood pressures at home periodically and reports readings in the 120s over 70s.  He denies headache, blurred vision, chest pain, shortness of breath, or lightheadedness.  BP Readings from Last 3 Encounters:  03/22/20 124/76  08/08/19 120/82  08/27/18 128/72   Dyslipidemia-currently on low-dose Lipitor 10 mg and 81 mg aspirin.  He is monitored by cardiology once a year due to family history of cardiac disease Lab Results  Component Value Date   CHOL 151 08/08/2019   HDL 50.00 08/08/2019   LDLCALC 81 08/08/2019   LDLDIRECT 134.9 08/25/2009   TRIG 99.0 08/08/2019   CHOLHDL 3 08/08/2019   BPH - well controlled with Flomax 0.4 mg   Obesity - has not been exercising recently and has not been eating healthy. He would like to be referred over to weight loss clinic to get him on a structured plan.   Wt Readings from Last 3 Encounters:  10/22/20 262 lb 12.8 oz (119.2 kg)  03/22/20 232 lb (105.2 kg)  08/08/19 245 lb (111.1 kg)     All immunizations and health maintenance protocols were reviewed with the patient and needed orders were placed.  Appropriate screening laboratory values were ordered for the patient including screening of hyperlipidemia, renal function and hepatic function. If indicated by BPH, a PSA was ordered.  Medication reconciliation,  past medical history, social history, problem list and allergies were reviewed in detail with the patient  Goals were established with  regard to weight loss, exercise, and  diet in compliance with medications  He is overdue for colonoscopy, missed the recall in 2020 and has not scheduled yet.   Review of Systems  Constitutional: Negative.   HENT: Negative.   Eyes: Negative.   Respiratory: Negative.   Cardiovascular: Negative.   Gastrointestinal: Negative.   Endocrine: Negative.   Genitourinary: Negative.   Musculoskeletal: Negative.   Skin: Negative.   Allergic/Immunologic: Negative.   Neurological: Negative.   Hematological: Negative.   Psychiatric/Behavioral: Negative.   All other systems reviewed and are negative.  Past Medical History:  Diagnosis Date   ADD (attention deficit disorder with hyperactivity)    Dyslipidemia    Hypertension    Hypothyroidism     Social History   Socioeconomic History   Marital status: Married    Spouse name: Not on file   Number of children: Not on file   Years of education: Not on file   Highest education level: Not on file  Occupational History   Occupation: Neurosurgeon  Tobacco Use   Smoking status: Former Smoker    Packs/day: 1.00    Years: 18.00    Pack years: 18.00    Types: Cigarettes    Quit date: 02/21/1996    Years since quitting: 24.6   Smokeless tobacco: Never Used  Vaping Use   Vaping Use: Never used  Substance and Sexual Activity   Alcohol use: Yes    Alcohol/week: 12.0 standard drinks    Types: 8 Glasses of wine, 4 Cans  of beer per week   Drug use: No   Sexual activity: Not on file  Other Topics Concern   Not on file  Social History Narrative   Not on file   Social Determinants of Health   Financial Resource Strain:    Difficulty of Paying Living Expenses: Not on file  Food Insecurity:    Worried About [place] in the Last Year: Not on file   Ran Out of Food in the Last Year: Not on file  Transportation Needs:    Lack of Transportation (Medical): Not on file   Lack of Transportation  (Non-Medical): Not on file  Physical Activity:    Days of Exercise per Week: Not on file   Minutes of Exercise per Session: Not on file  Stress:    Feeling of Stress : Not on file  Social Connections:    Frequency of Communication with Friends and Family: Not on file   Frequency of Social Gatherings with Friends and Family: Not on file   Attends Religious Services: Not on file   Active Member of Clubs or Organizations: Not on file   Attends Archivist Meetings: Not on file   Marital Status: Not on file  Intimate Partner Violence:    Fear of Current or Ex-Partner: Not on file   Emotionally Abused: Not on file   Physically Abused: Not on file   Sexually Abused: Not on file    Past Surgical History:  Procedure Laterality Date   PRE-MALIGNANT / BENIGN SKIN LESION EXCISION     abdomen     Family History  Problem Relation Age of Onset   Colon polyps Father    Cancer Maternal Uncle    Hypertension Other     No Known Allergies  Current Outpatient Medications on File Prior to Visit  Medication Sig Dispense Refill   aspirin 81 MG tablet Take 81 mg by mouth daily.       atorvastatin (LIPITOR) 10 MG tablet TAKE 1 TABLET BY MOUTH EVERY DAY 30 tablet 0   Coenzyme Q10 100 MG capsule Take by mouth.     levothyroxine (SYNTHROID) 75 MCG tablet TAKE 1 TABLET BY MOUTH EVERY DAY 30 tablet 1   lisinopril (ZESTRIL) 30 MG tablet TAKE 1 TABLET BY MOUTH EVERY DAY 90 tablet 3   tamsulosin (FLOMAX) 0.4 MG CAPS capsule Take 0.4 mg by mouth daily.     No current facility-administered medications on file prior to visit.    There were no vitals taken for this visit.      Objective:   Physical Exam Vitals and nursing note reviewed.  Constitutional:      General: He is not in acute distress.    Appearance: Normal appearance. He is well-developed and normal weight.  HENT:     Head: Normocephalic and atraumatic.     Right Ear: Tympanic membrane, ear canal and  external ear normal. There is no impacted cerumen.     Left Ear: Tympanic membrane, ear canal and external ear normal. There is no impacted cerumen.     Nose: Nose normal. No congestion or rhinorrhea.     Mouth/Throat:     Mouth: Mucous membranes are moist.     Pharynx: Oropharynx is clear. No oropharyngeal exudate or posterior oropharyngeal erythema.  Eyes:     General:        Right eye: No discharge.        Left eye: No discharge.  Extraocular Movements: Extraocular movements intact.     Conjunctiva/sclera: Conjunctivae normal.     Pupils: Pupils are equal, round, and reactive to light.  Neck:     Vascular: No carotid bruit.     Trachea: No tracheal deviation.  Cardiovascular:     Rate and Rhythm: Normal rate and regular rhythm.     Pulses: Normal pulses.     Heart sounds: Normal heart sounds. No murmur heard.  No friction rub. No gallop.   Pulmonary:     Effort: Pulmonary effort is normal. No respiratory distress.     Breath sounds: Normal breath sounds. No stridor. No wheezing, rhonchi or rales.  Chest:     Chest wall: No tenderness.  Abdominal:     General: Bowel sounds are normal. There is no distension.     Palpations: Abdomen is soft. There is no mass.     Tenderness: There is no abdominal tenderness. There is no right CVA tenderness, left CVA tenderness, guarding or rebound.     Hernia: No hernia is present.  Musculoskeletal:        General: No swelling, tenderness, deformity or signs of injury. Normal range of motion.     Right lower leg: No edema.     Left lower leg: No edema.  Lymphadenopathy:     Cervical: No cervical adenopathy.  Skin:    General: Skin is warm and dry.     Capillary Refill: Capillary refill takes less than 2 seconds.     Coloration: Skin is not jaundiced or pale.     Findings: No bruising, erythema, lesion or rash.  Neurological:     General: No focal deficit present.     Mental Status: He is alert and oriented to person, place, and time.       Cranial Nerves: No cranial nerve deficit.     Sensory: No sensory deficit.     Motor: No weakness.     Coordination: Coordination normal.     Gait: Gait normal.     Deep Tendon Reflexes: Reflexes normal.  Psychiatric:        Mood and Affect: Mood normal.        Behavior: Behavior normal.        Thought Content: Thought content normal.        Judgment: Judgment normal.       Assessment & Plan:  1. Routine general medical examination at a health care facility - Encouraged heart healthy diet and exercise - Follow up in one year or sooner if needd - CBC with Differential/Platelet; Future - Lipid panel; Future - TSH; Future - Hemoglobin A1c; Future - CMP with eGFR([facility]); Future  2. Essential hypertension - Controlled.  - CBC with Differential/Platelet; Future - Lipid panel; Future - TSH; Future - Hemoglobin A1c; Future - CMP with eGFR([facility]); Future - lisinopril (ZESTRIL) 30 MG tablet; Take 1 tablet (30 mg total) by mouth daily.  Dispense: 90 tablet; Refill: 3  3. Hypothyroidism, unspecified type - Controlled - CBC with Differential/Platelet; Future - Lipid panel; Future - TSH; Future - Hemoglobin A1c; Future - CMP with eGFR([facility]); Future - levothyroxine (SYNTHROID) 75 MCG tablet; Take 1 tablet (75 mcg total) by mouth daily.  Dispense: 90 tablet; Refill: 3  4. Dyslipidemia - Controlled - CBC with Differential/Platelet; Future - Lipid panel; Future - TSH; Future - Hemoglobin A1c; Future - CMP with eGFR([facility]); Future - atorvastatin (LIPITOR) 10 MG tablet; Take 1 tablet (10 mg total) by mouth daily.  Dispense: 90  tablet; Refill: 3  5. Benign prostatic hyperplasia without lower urinary tract symptoms  - PSA; Future - tamsulosin (FLOMAX) 0.4 MG CAPS capsule; Take 1 capsule (0.4 mg total) by mouth daily.  Dispense: 90 capsule; Refill: 3  6. Morbid obesity ([place])  - CBC with Differential/Platelet; Future - Lipid panel; Future - TSH; Future - Hemoglobin A1c;  Future - CMP with eGFR([facility]); Future - Amb Ref to Medical Weight Management  Dorothyann Peng

## 2020-10-22 NOTE — Addendum Note (Signed)
Addended by: Lerry Liner on: 10/22/2020 11:03 AM   Modules accepted: Orders

## 2020-10-22 NOTE — Patient Instructions (Addendum)
It was great seeing you today   We will follow up with you regarding your blood work   Please call [facility] to schedule your colonoscopy [phone]   Someone from the weight loss center will call you to schedule your appointment. Please start working on weight loss

## 2020-10-23 LAB — CBC WITH DIFFERENTIAL/PLATELET
Absolute Monocytes: 715 cells/uL (ref 200–950)
Basophils Absolute: 22 cells/uL (ref 0–200)
Basophils Relative: 0.3 %
Eosinophils Absolute: 168 cells/uL (ref 15–500)
Eosinophils Relative: 2.3 %
HCT: 43.7 % (ref 38.5–50.0)
Hemoglobin: 14.5 g/dL (ref 13.2–17.1)
Lymphs Abs: 2161 cells/uL (ref 850–3900)
MCH: 29.2 pg (ref 27.0–33.0)
MCHC: 33.2 g/dL (ref 32.0–36.0)
MCV: 88.1 fL (ref 80.0–100.0)
MPV: 10.9 fL (ref 7.5–12.5)
Monocytes Relative: 9.8 %
Neutro Abs: 4234 cells/uL (ref 1500–7800)
Neutrophils Relative %: 58 %
Platelets: 247 10*3/uL (ref 140–400)
RBC: 4.96 10*6/uL (ref 4.20–5.80)
RDW: 12.7 % (ref 11.0–15.0)
Total Lymphocyte: 29.6 %
WBC: 7.3 10*3/uL (ref 3.8–10.8)

## 2020-10-23 LAB — COMPLETE METABOLIC PANEL WITH GFR
AG Ratio: 1.8 (calc) (ref 1.0–2.5)
ALT: 35 U/L (ref 9–46)
AST: 21 U/L (ref 10–35)
Albumin: 4.8 g/dL (ref 3.6–5.1)
Alkaline phosphatase (APISO): 68 U/L (ref 35–144)
BUN: 23 mg/dL (ref 7–25)
CO2: 27 mmol/L (ref 20–32)
Calcium: 10.1 mg/dL (ref 8.6–10.3)
Chloride: 102 mmol/L (ref 98–110)
Creat: 1.06 mg/dL (ref 0.70–1.33)
GFR, Est African American: 89 mL/min/{1.73_m2} (ref >=60)
GFR, Est Non African American: 77 mL/min/{1.73_m2} (ref >=60)
Globulin: 2.6 g/dL (calc) (ref 1.9–3.7)
Glucose, Bld: 140 mg/dL — ABNORMAL HIGH (ref 65–99)
Potassium: 5 mmol/L (ref 3.5–5.3)
Sodium: 139 mmol/L (ref 135–146)
Total Bilirubin: 1.2 mg/dL (ref 0.2–1.2)
Total Protein: 7.4 g/dL (ref 6.1–8.1)

## 2020-10-23 LAB — LIPID PANEL
Cholesterol: 190 mg/dL (ref <200)
HDL: 56 mg/dL (ref >=40)
LDL Cholesterol (Calc): 110 mg/dL (calc) — ABNORMAL HIGH
Non-HDL Cholesterol (Calc): 134 mg/dL (calc) — ABNORMAL HIGH (ref <130)
Total CHOL/HDL Ratio: 3.4 (calc) (ref <5.0)
Triglycerides: 129 mg/dL (ref <150)

## 2020-10-23 LAB — HEMOGLOBIN A1C
Hgb A1c MFr Bld: 6.8 % of total Hgb — ABNORMAL HIGH (ref <5.7)
Mean Plasma Glucose: 148 (calc)
eAG (mmol/L): 8.2 (calc)

## 2020-10-23 LAB — TSH: TSH: 4.67 mIU/L — ABNORMAL HIGH (ref 0.40–4.50)

## 2020-10-23 LAB — PSA: PSA: 2.23 ng/mL (ref <=4.0)

## 2020-12-16 DIAGNOSIS — U071 COVID-19: Secondary | ICD-10-CM | POA: Diagnosis not present

## 2021-01-04 ENCOUNTER — Encounter: Payer: Self-pay | Admitting: Internal Medicine

## 2021-02-15 ENCOUNTER — Other Ambulatory Visit: Payer: Self-pay

## 2021-02-15 ENCOUNTER — Ambulatory Visit (AMBULATORY_SURGERY_CENTER): Payer: Self-pay

## 2021-02-15 VITALS — Ht 71.0 in | Wt 264.0 lb

## 2021-02-15 DIAGNOSIS — Z1211 Encounter for screening for malignant neoplasm of colon: Secondary | ICD-10-CM

## 2021-02-15 NOTE — Progress Notes (Signed)
No egg or soy allergy known to patient  No issues with past sedation with any surgeries or procedures No intubation problems in the past  No FH of Malignant Hyperthermia No diet pills per patient No home 02 use per patient  No blood thinners per patient  Pt denies issues with constipation  No A fib or A flutter  EMMI video via MyChart  COVID 19 guidelines implemented in [place] today with Pt and RN  Discussed with pt there will be an out-of-pocket cost for prep and that varies from $0 to 70 dollars  Due to the COVID-19 pandemic we are asking patients to follow certain guidelines.   Pt aware of COVID protocols and [facility] guidelines

## 2021-02-16 ENCOUNTER — Encounter: Payer: Self-pay | Admitting: Internal Medicine

## 2021-02-22 ENCOUNTER — Other Ambulatory Visit: Payer: Self-pay

## 2021-02-22 ENCOUNTER — Ambulatory Visit (AMBULATORY_SURGERY_CENTER): Payer: BLUE CROSS/BLUE SHIELD | Admitting: Internal Medicine

## 2021-02-22 ENCOUNTER — Encounter: Payer: Self-pay | Admitting: Internal Medicine

## 2021-02-22 VITALS — BP 130/73 | HR 67 | Temp 98.7°F | Resp 10 | Ht 71.0 in | Wt 264.0 lb

## 2021-02-22 DIAGNOSIS — Z1211 Encounter for screening for malignant neoplasm of colon: Secondary | ICD-10-CM

## 2021-02-22 MED ORDER — SODIUM CHLORIDE 0.9 % IV SOLN: 500.0000 mL | Freq: Once | INTRAVENOUS | Status: DC

## 2021-02-22 NOTE — Op Note (Signed)
[facility] Patient Name: Rayquon Reamer Procedure Date: 02/22/2021 2:40 PM MRN: [id number] Endoscopist: Iva Boop , MD Age: 60 Referring MD:  Date of Birth: 1961/11/25 Gender: Male Account #: [id number] Procedure:                Colonoscopy Indications:              Screening for colorectal malignant neoplasm, Last                            colonoscopy: 2010 Medicines:                Propofol per Anesthesia, Monitored Anesthesia Care Procedure:                Pre-Anesthesia Assessment:                           - Prior to the procedure, a History and Physical                            was performed, and patient medications and                            allergies were reviewed. The patient's tolerance of                            previous anesthesia was also reviewed. The risks                            and benefits of the procedure and the sedation                            options and risks were discussed with the patient.                            All questions were answered, and informed consent                            was obtained. Prior Anticoagulants: The patient has                            taken no previous anticoagulant or antiplatelet                            agents. ASA Grade Assessment: II - A patient with                            mild systemic disease. After reviewing the risks                            and benefits, the patient was deemed in                            satisfactory condition to undergo the procedure.  After obtaining informed consent, the colonoscope                            was passed under direct vision. Throughout the                            procedure, the patient's blood pressure, pulse, and                            oxygen saturations were monitored continuously. The                            Olympus CF-HQ190L [id number]) Colonoscope was                            introduced through the  anus and advanced to the the                            cecum, identified by appendiceal orifice and                            ileocecal valve. The quality of the bowel                            preparation was good. The colonoscopy was performed                            without difficulty. The patient tolerated the                            procedure well. The bowel preparation used was                            Miralax via split dose instruction. Scope In: 2:53:33 PM Scope Out: 3:06:25 PM Scope Withdrawal Time: 0 hours 10 minutes 32 seconds  Total Procedure Duration: 0 hours 12 minutes 52 seconds  Findings:                 The perianal and digital rectal examinations were                            normal. Pertinent negatives include normal prostate                            (size, shape, and consistency).                           The colon (entire examined portion) appeared normal.                           No additional abnormalities were found on                            retroflexion. Complications:            No immediate complications.  Estimated blood loss:                            None. Estimated Blood Loss:     Estimated blood loss: none. Impression:               - The entire examined colon is normal.                           - No specimens collected. Recommendation:           - Repeat colonoscopy in 10 years for screening                            purposes.                           - Patient has a contact number available for                            emergencies. The signs and symptoms of potential                            delayed complications were discussed with the                            patient. Return to normal activities tomorrow.                            Written discharge instructions were provided to the                            patient.                           - Resume previous diet.                           - Continue present  medications. Iva Boop, MD 02/22/2021 3:11:48 PM This report has been signed electronically.

## 2021-02-22 NOTE — Patient Instructions (Addendum)
Exam was normal - no polyps or cancer seen!  Next routine colonoscopy or other screening test in 10 years - 2032.  I appreciate the opportunity to care for you. Iva Boop, MD, [facility] Resume previous diet Continue current medications  YOU HAD AN ENDOSCOPIC PROCEDURE TODAY AT THE [facility]:   Refer to the procedure report that was given to you for any specific questions about what was found during the examination.  If the procedure report does not answer your questions, please call your gastroenterologist to clarify.  If you requested that your care partner not be given the details of your procedure findings, then the procedure report has been included in a sealed envelope for you to review at your convenience later.  YOU SHOULD EXPECT: Some feelings of bloating in the abdomen. Passage of more gas than usual.  Walking can help get rid of the air that was put into your GI tract during the procedure and reduce the bloating. If you had a lower endoscopy (such as a colonoscopy or flexible sigmoidoscopy) you may notice spotting of blood in your stool or on the toilet paper. If you underwent a bowel prep for your procedure, you may not have a normal bowel movement for a few days.  Please Note:  You might notice some irritation and congestion in your nose or some drainage.  This is from the oxygen used during your procedure.  There is no need for concern and it should clear up in a day or so.  SYMPTOMS TO REPORT IMMEDIATELY:   Following lower endoscopy (colonoscopy or flexible sigmoidoscopy):  Excessive amounts of blood in the stool  Significant tenderness or worsening of abdominal pains  Swelling of the abdomen that is new, acute  Fever of 100F or higher  For urgent or emergent issues, a gastroenterologist can be reached at any hour by calling [phone]. Do not use MyChart messaging for urgent concerns.   DIET:  We do recommend a small meal at first, but then you may  proceed to your regular diet.  Drink plenty of fluids but you should avoid alcoholic beverages for 24 hours.  ACTIVITY:  You should plan to take it easy for the rest of today and you should NOT DRIVE or use heavy machinery until tomorrow (because of the sedation medicines used during the test).    FOLLOW UP: Our staff will call the number listed on your records 48-72 hours following your procedure to check on you and address any questions or concerns that you may have regarding the information given to you following your procedure. If we do not reach you, we will leave a message.  We will attempt to reach you two times.  During this call, we will ask if you have developed any symptoms of COVID 19. If you develop any symptoms (ie: fever, flu-like symptoms, shortness of breath, cough etc.) before then, please call [phone].  If you test positive for Covid 19 in the 2 weeks post procedure, please call and report this information to [place].    If any biopsies were taken you will be contacted by phone or by letter within the next 1-3 weeks.  Please call us at [phone] if you have not heard about the biopsies in 3 weeks.   SIGNATURES/CONFIDENTIALITY: You and/or your care partner have signed paperwork which will be entered into your electronic medical record.  These signatures attest to the fact that that the information above on your After Visit Summary  has been reviewed and is understood.  Full responsibility of the confidentiality of this discharge information lies with you and/or your care-partner. 

## 2021-02-22 NOTE — Progress Notes (Signed)
A and O x3. Report to RN. Tolerated MAC anesthesia well.

## 2021-02-22 NOTE — Progress Notes (Signed)
Vitals by CW 

## 2021-02-24 ENCOUNTER — Telehealth: Payer: Self-pay

## 2021-02-24 NOTE — Telephone Encounter (Signed)
  Follow up Call-  Call back number 02/22/2021  Post procedure Call Back phone  # [phone]  Permission to leave phone message Yes  Some recent data might be hidden     Patient questions:  Do you have a fever, pain , or abdominal swelling? No. Pain Score  0 *  Have you tolerated food without any problems? Yes.    Have you been able to return to your normal activities? Yes.    Do you have any questions about your discharge instructions: Diet   No. Medications  No. Follow up visit  No.  Do you have questions or concerns about your Care? No.  Actions: * If pain score is 4 or above: No action needed, pain <4. 1. Have you developed a fever since your procedure? no  2.   Have you had an respiratory symptoms (SOB or cough) since your procedure? no  3.   Have you tested positive for COVID 19 since your procedure no  4.   Have you had any family members/close contacts diagnosed with the COVID 19 since your procedure?  no   If yes to any of these questions please route to Laverna Peace, RN and Karlton Lemon, RN

## 2021-05-11 ENCOUNTER — Other Ambulatory Visit: Payer: Self-pay

## 2021-05-11 ENCOUNTER — Ambulatory Visit (INDEPENDENT_AMBULATORY_CARE_PROVIDER_SITE_OTHER): Payer: BC Managed Care – PPO | Admitting: Family Medicine

## 2021-05-11 ENCOUNTER — Encounter: Payer: Self-pay | Admitting: Family Medicine

## 2021-05-11 VITALS — BP 150/74 | HR 73 | Temp 98.3°F | Wt 256.3 lb

## 2021-05-11 DIAGNOSIS — J209 Acute bronchitis, unspecified: Secondary | ICD-10-CM | POA: Diagnosis not present

## 2021-05-11 MED ORDER — BENZONATATE 100 MG PO CAPS: 100.0000 mg | ORAL_CAPSULE | Freq: Three times a day (TID) | ORAL | 0 refills | Status: DC | PRN

## 2021-05-11 MED ORDER — AZITHROMYCIN 250 MG PO TABS: ORAL_TABLET | ORAL | 0 refills | Status: AC

## 2021-05-11 NOTE — Patient Instructions (Signed)
Acute Bronchitis, Adult  Acute bronchitis is sudden or acute swelling of the air tubes (bronchi) in the lungs. Acute bronchitis causes these tubes to fill with mucus, which can make it hard to breathe. It can also cause coughing or wheezing. In adults, acute bronchitis usually goes away within 2 weeks. A cough caused by bronchitis may last up to 3 weeks. Smoking, allergies, and asthma can make the condition worse. What are the causes? This condition can be caused by germs and by substances that irritate the lungs, including:  Cold and flu viruses. The most common cause of this condition is the virus that causes the common cold.  Bacteria.  Substances that irritate the lungs, including: ? Smoke from cigarettes and other forms of tobacco. ? Dust and pollen. ? Fumes from chemical products, gases, or burned fuel. ? Other materials that pollute indoor or outdoor air.  Close contact with someone who has acute bronchitis. What increases the risk? The following factors may make you more likely to develop this condition:  A weak body's defense system, also called the immune system.  A condition that affects your lungs and breathing, such as asthma. What are the signs or symptoms? Common symptoms of this condition include:  Lung and breathing problems, such as: ? Coughing. This may bring up clear, yellow, or green mucus from your lungs (sputum). ? Wheezing. ? Having too much mucus in your lungs (chest congestion). ? Having shortness of breath.  A fever.  Chills.  Aches and pains, including: ? Tightness in your chest and other body aches. ? A sore throat. How is this diagnosed? This condition is usually diagnosed based on:  Your symptoms and medical history.  A physical exam. You may also have other tests, including tests to rule out other conditions, such pneumonia. These tests include:  A test of lung function.  Test of a mucus sample to look for the presence of  bacteria.  Tests to check the oxygen level in your blood.  Blood tests.  Chest X-ray. How is this treated? Most cases of acute bronchitis clear up over time without treatment. Your health care provider may recommend:  Drinking more fluids. This can thin your mucus, which may improve your breathing.  Taking a medicine for a fever or cough.  Using a device that gets medicine into your lungs (inhaler) to help improve breathing and control coughing.  Using a vaporizer or a humidifier. These are machines that add water to the air to help you breathe better. Follow these instructions at home: Activity  Get plenty of rest.  Return to your normal activities as told by your health care provider. Ask your health care provider what activities are safe for you. Lifestyle  Drink enough fluid to keep your urine pale yellow.  Do not drink alcohol.  Do not use any products that contain nicotine or tobacco, such as cigarettes, e-cigarettes, and chewing tobacco. If you need help quitting, ask your health care provider. Be aware that: ? Your bronchitis will get worse if you smoke or breathe in other people's smoke (secondhand smoke). ? Your lungs will heal faster if you quit smoking. General instructions  Take over-the-counter and prescription medicines only as told by your health care provider.  Use an inhaler, vaporizer, or humidifier as told by your health care provider.  If you have a sore throat, gargle with a salt-water mixture 3-4 times a day or as needed. To make a salt-water mixture, completely dissolve -1 tsp (3-6 g)   of salt in 1 cup (237 mL) of warm water.  Keep all follow-up visits as told by your health care provider. This is important.   How is this prevented? To lower your risk of getting this condition again:  Wash your hands often with soap and water. If soap and water are not available, use hand sanitizer.  Avoid contact with people who have cold symptoms.  Try not to  touch your mouth, nose, or eyes with your hands.  Avoid places where there are fumes from chemicals. Breathing these fumes will make your condition worse.  Get the flu shot every year.   Contact a health care provider if:  Your symptoms do not improve after 2 weeks of treatment.  You vomit more than once or twice.  You have symptoms of dehydration such as: ? Dark urine. ? Dry skin or eyes. ? Increased thirst. ? Headaches. ? Confusion. ? Muscle cramps. Get help right away if you:  Cough up blood.  Feel pain in your chest.  Have severe shortness of breath.  Faint or keep feeling like you are going to faint.  Have a severe headache.  Have fever or chills that get worse. These symptoms may represent a serious problem that is an emergency. Do not wait to see if the symptoms will go away. Get medical help right away. Call your local emergency services (911 in the [place] Do not drive yourself to the hospital. Summary  Acute bronchitis is sudden (acute) inflammation of the air tubes (bronchi) between the windpipe and the lungs. In adults, acute bronchitis usually goes away within 2 weeks, although coughing may last 3 weeks or longer  Take over-the-counter and prescription medicines only as told by your health care provider.  Drink enough fluid to keep your urine pale yellow.  Contact a health care provider if your symptoms do not improve after 2 weeks of treatment.  Get help right away if you cough up blood, faint, or have chest pain or shortness of breath. This information is not intended to replace advice given to you by your health care provider. Make sure you discuss any questions you have with your health care provider. Document Revised: 08/04/2019 Document Reviewed: 06/13/2019 Elsevier Patient Education  2021 [employer]  

## 2021-05-11 NOTE — Progress Notes (Signed)
Established Patient Office Visit  Subjective:  Patient ID: William Rosales, male    DOB: Feb 22, 1961  Age: 60 y.o. MRN: [id number]  CC:  Chief Complaint  Patient presents with  . chest congestion    Chest congestion, productive cough, x 1 week     HPI William Rosales presents for chest congestion and cough for the past week or so.  He states that his cough is productive of yellowish mucus.  Cough interfering some with sleep.  No fevers or chills.  No myalgias.  He had some nasal congestion initially and this seems to be improving.  Denies any significant dyspnea.  No nausea or vomiting.  He is taking some type of over-the-counter cough medication without much improvement.  Quit smoking 1997.  No hemoptysis.  Past Medical History:  Diagnosis Date  . ADD (attention deficit disorder with hyperactivity)   . Dyslipidemia   . Hyperlipidemia    on meds  . Hypertension    on meds  . Hypothyroidism    on meds    Past Surgical History:  Procedure Laterality Date  . COLONOSCOPY  2010   CG-F/V-movi(exc)-normal-hems  . PRE-MALIGNANT / BENIGN SKIN LESION EXCISION     abdomen   . WISDOM TOOTH EXTRACTION      Family History  Problem Relation Age of Onset  . Colon polyps Father 46  . Stroke Father 54  . Non-Hodgkin's lymphoma Father 45  . Lung cancer Maternal Uncle 45  . Hypertension Other   . Colon cancer Neg Hx   . Esophageal cancer Neg Hx   . Stomach cancer Neg Hx   . Rectal cancer Neg Hx     Social History   Socioeconomic History  . Marital status: Married    Spouse name: Not on file  . Number of children: Not on file  . Years of education: Not on file  . Highest education level: Not on file  Occupational History  . Occupation: Pensions consultant  Tobacco Use  . Smoking status: Former Smoker    Packs/day: 1.00    Years: 18.00    Pack years: 18.00    Types: Cigarettes    Quit date: 02/21/1996    Years since quitting: 25.2  . Smokeless tobacco:  Never Used  Vaping Use  . Vaping Use: Never used  Substance and Sexual Activity  . Alcohol use: Yes    Alcohol/week: 7.0 standard drinks    Types: 7 Standard drinks or equivalent per week  . Drug use: No  . Sexual activity: Not on file  Other Topics Concern  . Not on file  Social History Narrative  . Not on file   Social Determinants of Health   Financial Resource Strain: Not on file  Food Insecurity: Not on file  Transportation Needs: Not on file  Physical Activity: Not on file  Stress: Not on file  Social Connections: Not on file  Intimate Partner Violence: Not on file    Outpatient Medications Prior to Visit  Medication Sig Dispense Refill  . atorvastatin (LIPITOR) 10 MG tablet Take 1 tablet (10 mg total) by mouth daily. 90 tablet 3  . Coenzyme Q10 100 MG capsule Take 100 mg by mouth daily.    Marland Kitchen levothyroxine (SYNTHROID) 75 MCG tablet Take 1 tablet (75 mcg total) by mouth daily. 90 tablet 3  . lisinopril (ZESTRIL) 30 MG tablet Take 1 tablet (30 mg total) by mouth daily. 90 tablet 3  . tamsulosin (FLOMAX) 0.4 MG CAPS  capsule Take 1 capsule (0.4 mg total) by mouth daily. 90 capsule 3   No facility-administered medications prior to visit.    No Known Allergies  ROS Review of Systems  Constitutional: Negative for chills, fever and unexpected weight change.  Respiratory: Positive for cough. Negative for shortness of breath and wheezing.   Cardiovascular: Negative for chest pain.      Objective:    Physical Exam Vitals reviewed.  Constitutional:      Appearance: Normal appearance.  HENT:     Right Ear: Ear canal normal.     Left Ear: Ear canal normal.  Cardiovascular:     Rate and Rhythm: Normal rate and regular rhythm.  Pulmonary:     Effort: Pulmonary effort is normal.     Breath sounds: Normal breath sounds. No wheezing or rales.  Musculoskeletal:     Cervical back: Neck supple.  Lymphadenopathy:     Cervical: No cervical adenopathy.  Neurological:      Mental Status: He is alert.     BP (!) 150/74 (BP Location: Left Arm, Patient Position: Sitting, Cuff Size: Normal)   Pulse 73   Temp 98.3 F (36.8 C) (Oral)   Wt 256 lb 4.8 oz (116.3 kg)   SpO2 97%   BMI 35.75 kg/m  Wt Readings from Last 3 Encounters:  05/11/21 256 lb 4.8 oz (116.3 kg)  02/22/21 264 lb (119.7 kg)  02/15/21 264 lb (119.7 kg)     Health Maintenance Due  Topic Date Due  . COVID-19 Vaccine (1) Never done  . HIV Screening  Never done  . Zoster Vaccines- Shingrix (1 of 2) Never done  . TETANUS/TDAP  08/26/2019    There are no preventive care reminders to display for this patient.  Lab Results  Component Value Date   TSH 4.67 (H) 10/22/2020   Lab Results  Component Value Date   WBC 7.3 10/22/2020   HGB 14.5 10/22/2020   HCT 43.7 10/22/2020   MCV 88.1 10/22/2020   PLT 247 10/22/2020   Lab Results  Component Value Date   NA 139 10/22/2020   K 5.0 10/22/2020   CO2 27 10/22/2020   GLUCOSE 140 (H) 10/22/2020   BUN 23 10/22/2020   CREATININE 1.06 10/22/2020   BILITOT 1.2 10/22/2020   ALKPHOS 55 08/08/2019   AST 21 10/22/2020   ALT 35 10/22/2020   PROT 7.4 10/22/2020   ALBUMIN 4.6 08/08/2019   CALCIUM 10.1 10/22/2020   GFR 72.69 08/08/2019   Lab Results  Component Value Date   CHOL 190 10/22/2020   Lab Results  Component Value Date   HDL 56 10/22/2020   Lab Results  Component Value Date   LDLCALC 110 (H) 10/22/2020   Lab Results  Component Value Date   TRIG 129 10/22/2020   Lab Results  Component Value Date   CHOLHDL 3.4 10/22/2020   Lab Results  Component Value Date   HGBA1C 6.8 (H) 10/22/2020      Assessment & Plan:   Patient presents with 1 week history of productive cough.  Suspect acute bronchitis.  We explained that most bronchitis is viral.  He does not have any fever or dyspnea or other red flags and nonfocal exam.  -Stay well-hydrated -Consider over-the-counter Mucinex 1200 mg twice daily -Tessalon Perles 100 mg  every 8 hours as needed for cough -We printed a prescription for Zithromax but recommend holding at this time unless he develops any fever or worsening symptoms.  Meds ordered this  encounter  Medications  . benzonatate (TESSALON PERLES) 100 MG capsule    Sig: Take 1 capsule (100 mg total) by mouth 3 (three) times daily as needed for cough.    Dispense:  30 capsule    Refill:  0  . azithromycin (ZITHROMAX Z-PAK) 250 MG tablet    Sig: Take 2 tablets (500 mg) on  Day 1,  followed by 1 tablet (250 mg) once daily on Days 2 through 5.    Dispense:  6 each    Refill:  0    Follow-up: No follow-ups on file.    Evelena Peat, MD

## 2021-05-24 IMAGING — CT CT CHEST W/O CM
2 of 4 series · 15 of 36 positions shown, 18 images · non-contrast
Comparison: 08/19/2018 and 06/12/2017

CLINICAL DATA: Follow-up indeterminate pulmonary nodules.

EXAM:
CT CHEST WITHOUT CONTRAST
TECHNIQUE: Multidetector CT imaging of the chest was performed following the
standard protocol without IV contrast.

[Series 2: thorax · axial · 0.85mm/px · z∈[-395,-91]mm · 12 of 180 slices shown, 15 images]
[im 14/180  mediastinal]
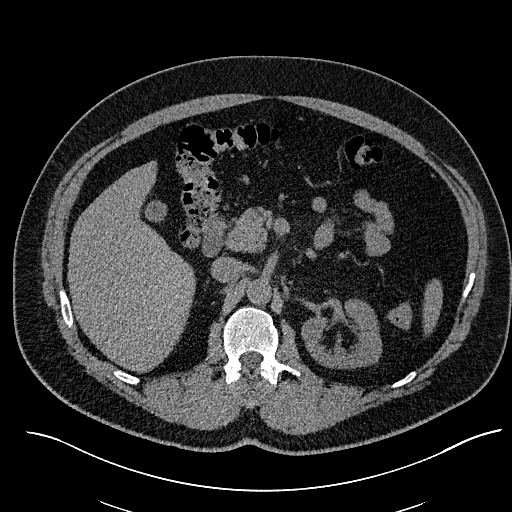
[im 14/180  lung]
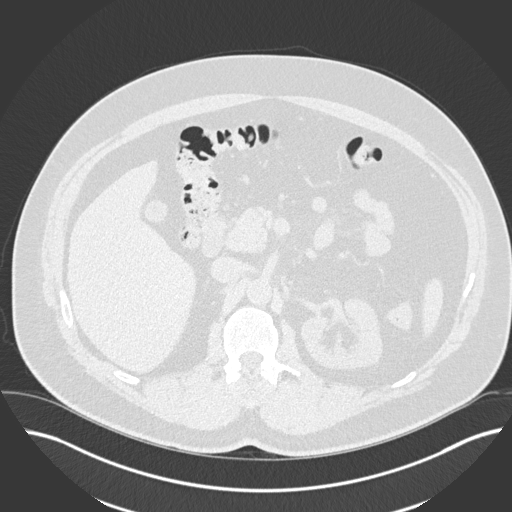
[im 28/180  lung]
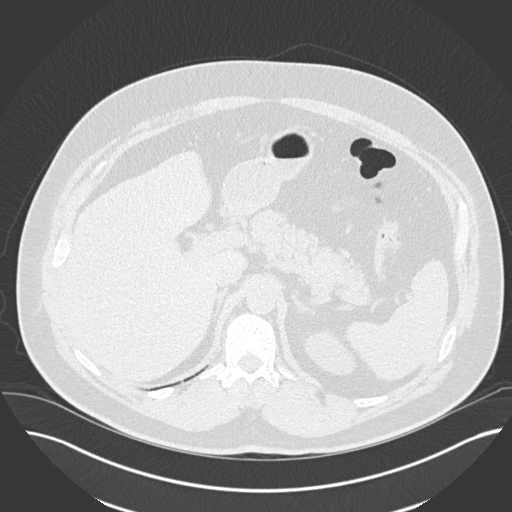
[im 42/180  lung]
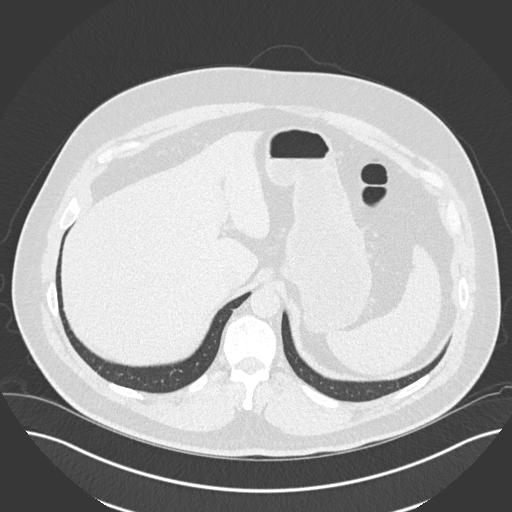
[im 56/180  lung]
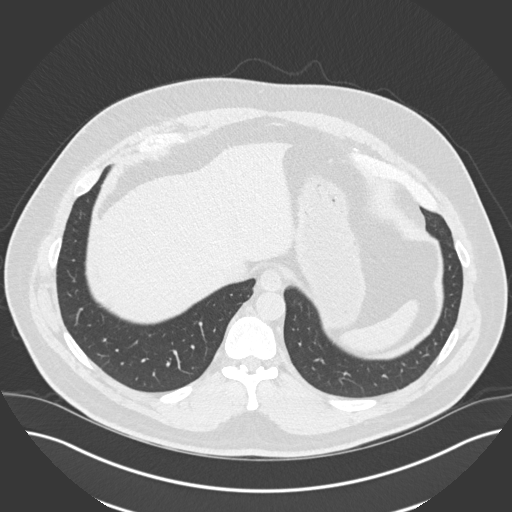
[im 69/180  mediastinal]
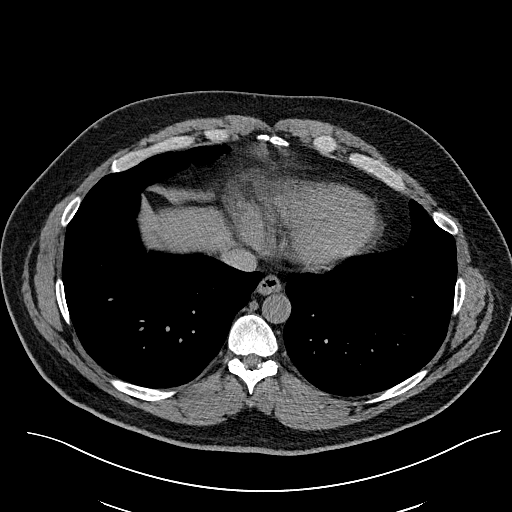
[im 69/180  lung]
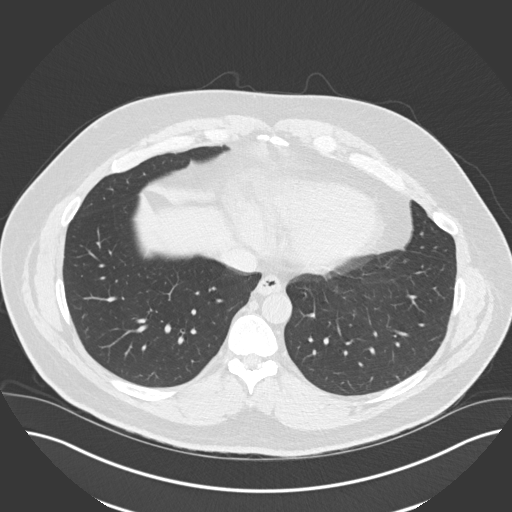
[im 83/180  lung]
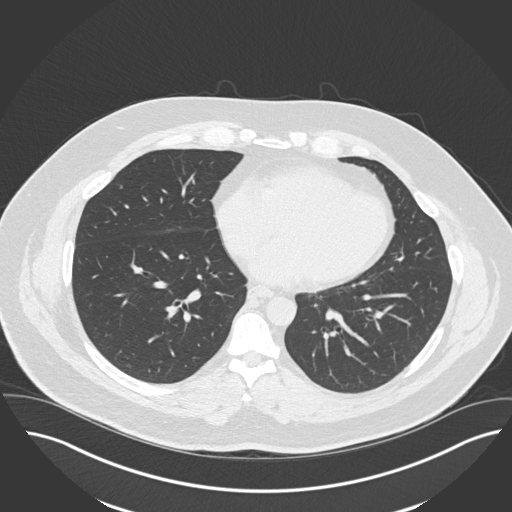
[im 97/180  lung]
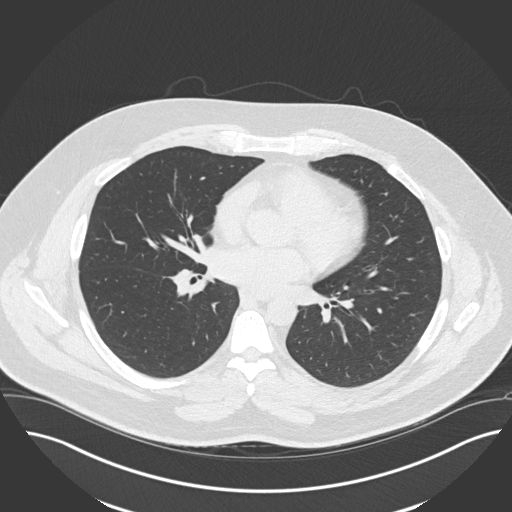
[im 111/180  lung]
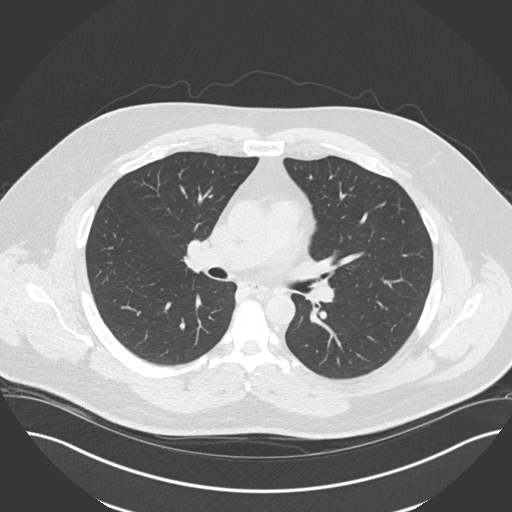
[im 124/180  mediastinal]
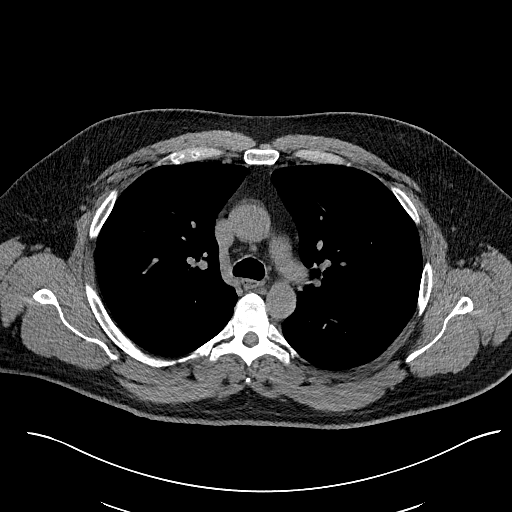
[im 124/180  lung]
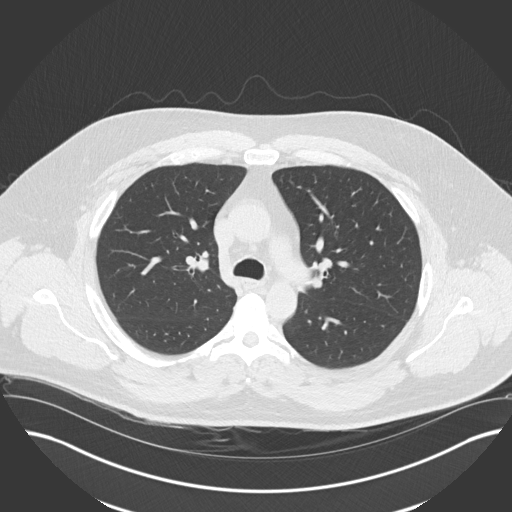
[im 138/180  lung]
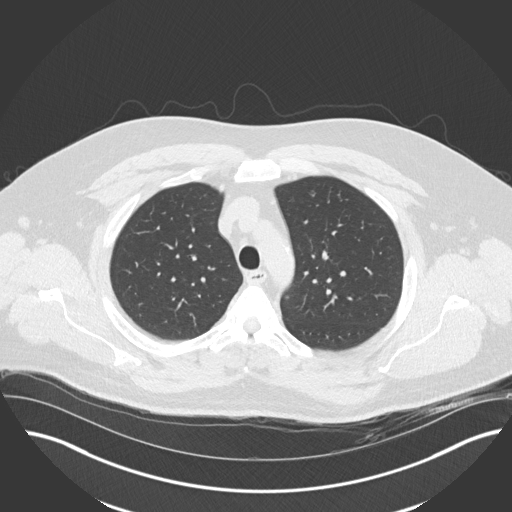
[im 152/180  lung]
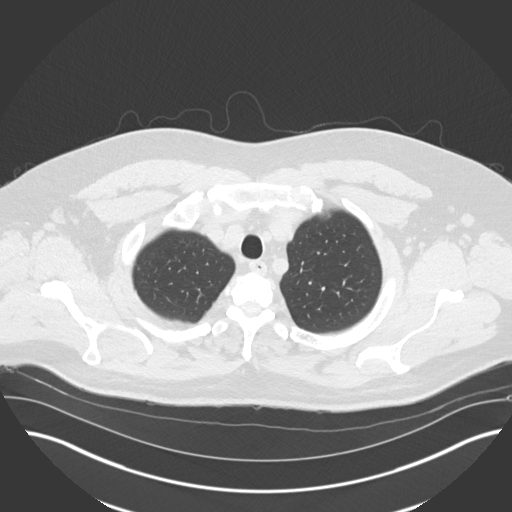
[im 166/180  lung]
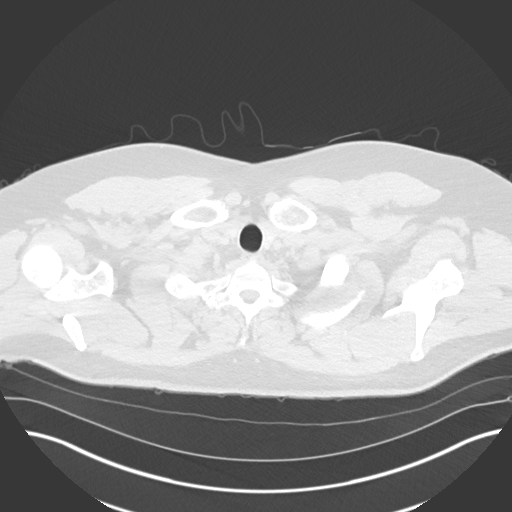

[Series 5: coronal · coronal · 0.70mm/px · 3 of 129 slices shown]
[im 26/129  lung]
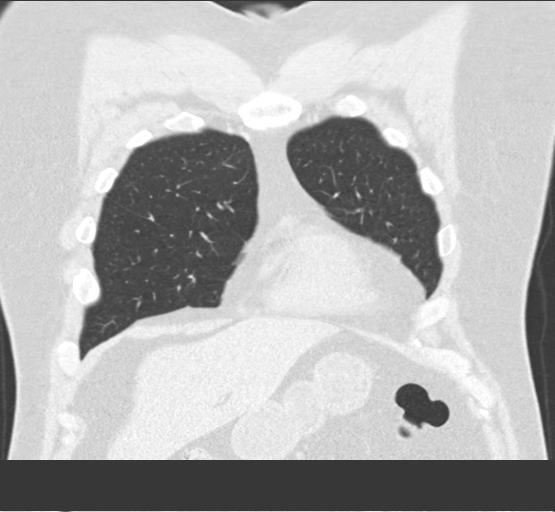
[im 52/129  lung]
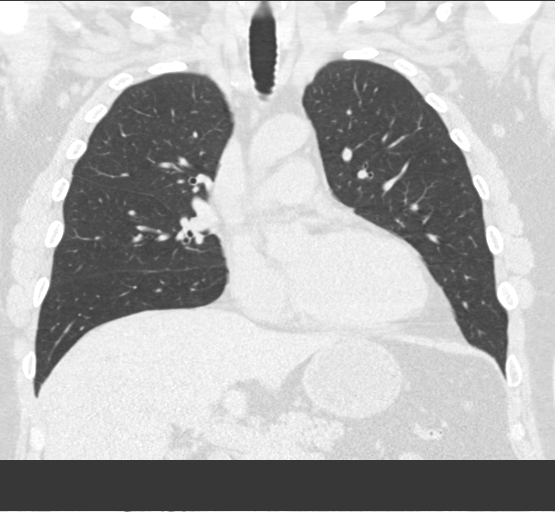
[im 77/129  lung]
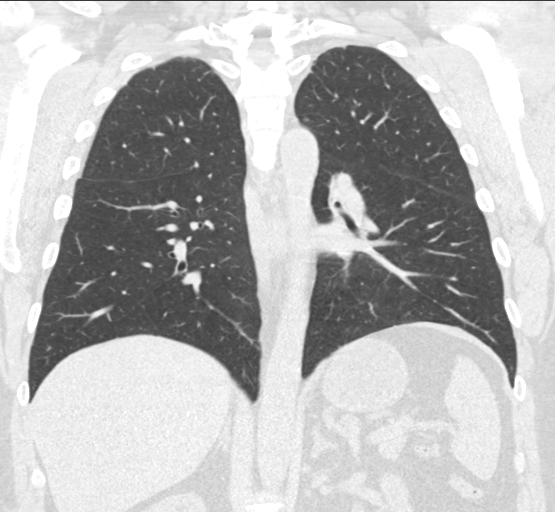

[15 of 36 positions shown; findings below may reference images not displayed]

FINDINGS: Cardiovascular:  No acute findings. Aortic atherosclerosis noted.

Mediastinum/Nodes: No masses or pathologically enlarged lymph nodes
identified on this unenhanced exam.

Lungs/Pleura: Scattered tiny pulmonary nodules measuring less than 5
mm are again seen in both lungs, without significant change compared
to prior studies. These are consistent with benign etiology. No
evidence of infiltrate or pleural effusion.

Upper Abdomen:  Unremarkable.

Musculoskeletal:  No suspicious bone lesions.
IMPRESSION: Stable tiny bilateral pulmonary nodules, consistent with benign
etiology. No active lung disease.

Aortic Atherosclerosis ([id number]-T8J.J).

## 2021-09-28 ENCOUNTER — Other Ambulatory Visit: Payer: Self-pay | Admitting: Adult Health

## 2021-09-28 DIAGNOSIS — E039 Hypothyroidism, unspecified: Secondary | ICD-10-CM

## 2021-09-29 NOTE — Telephone Encounter (Signed)
Rx will be filled for 30 days. Pt needs to schedule a CPR for further refills

## 2021-09-29 NOTE — Telephone Encounter (Signed)
Pt has been scheduled.  °

## 2021-10-01 ENCOUNTER — Other Ambulatory Visit: Payer: Self-pay | Admitting: Adult Health

## 2021-10-01 DIAGNOSIS — I10 Essential (primary) hypertension: Secondary | ICD-10-CM

## 2021-10-01 DIAGNOSIS — E785 Hyperlipidemia, unspecified: Secondary | ICD-10-CM

## 2021-10-26 ENCOUNTER — Telehealth: Payer: Self-pay | Admitting: Adult Health

## 2021-10-26 ENCOUNTER — Ambulatory Visit (INDEPENDENT_AMBULATORY_CARE_PROVIDER_SITE_OTHER): Payer: BC Managed Care – PPO | Admitting: Adult Health

## 2021-10-26 ENCOUNTER — Encounter: Payer: Self-pay | Admitting: Adult Health

## 2021-10-26 ENCOUNTER — Other Ambulatory Visit: Payer: Self-pay | Admitting: Adult Health

## 2021-10-26 VITALS — BP 100/78 | HR 78 | Temp 98.6°F | Ht 70.75 in | Wt 248.0 lb

## 2021-10-26 DIAGNOSIS — E785 Hyperlipidemia, unspecified: Secondary | ICD-10-CM

## 2021-10-26 DIAGNOSIS — Z0001 Encounter for general adult medical examination with abnormal findings: Secondary | ICD-10-CM | POA: Diagnosis not present

## 2021-10-26 DIAGNOSIS — Z23 Encounter for immunization: Secondary | ICD-10-CM

## 2021-10-26 DIAGNOSIS — E039 Hypothyroidism, unspecified: Secondary | ICD-10-CM | POA: Diagnosis not present

## 2021-10-26 DIAGNOSIS — Z0000 Encounter for general adult medical examination without abnormal findings: Secondary | ICD-10-CM

## 2021-10-26 DIAGNOSIS — E7439 Other disorders of intestinal carbohydrate absorption: Secondary | ICD-10-CM

## 2021-10-26 DIAGNOSIS — N400 Enlarged prostate without lower urinary tract symptoms: Secondary | ICD-10-CM

## 2021-10-26 DIAGNOSIS — R7989 Other specified abnormal findings of blood chemistry: Secondary | ICD-10-CM

## 2021-10-26 DIAGNOSIS — I10 Essential (primary) hypertension: Secondary | ICD-10-CM

## 2021-10-26 LAB — CBC WITH DIFFERENTIAL/PLATELET
Basophils Absolute: 0 10*3/uL (ref 0.0–0.1)
Basophils Relative: 0.2 % (ref 0.0–3.0)
Eosinophils Absolute: 0.1 10*3/uL (ref 0.0–0.7)
Eosinophils Relative: 1.3 % (ref 0.0–5.0)
HCT: 44.2 % (ref 39.0–52.0)
Hemoglobin: 15.1 g/dL (ref 13.0–17.0)
Lymphocytes Relative: 26 % (ref 12.0–46.0)
Lymphs Abs: 1.9 10*3/uL (ref 0.7–4.0)
MCHC: 34.1 g/dL (ref 30.0–36.0)
MCV: 86.2 fl (ref 78.0–100.0)
Monocytes Absolute: 0.7 10*3/uL (ref 0.1–1.0)
Monocytes Relative: 9.4 % (ref 3.0–12.0)
Neutro Abs: 4.6 10*3/uL (ref 1.4–7.7)
Neutrophils Relative %: 63.1 % (ref 43.0–77.0)
Platelets: 233 10*3/uL (ref 150.0–400.0)
RBC: 5.12 Mil/uL (ref 4.22–5.81)
RDW: 12.4 % (ref 11.5–15.5)
WBC: 7.3 10*3/uL (ref 4.0–10.5)

## 2021-10-26 LAB — COMPREHENSIVE METABOLIC PANEL
ALT: 26 U/L (ref 0–53)
AST: 21 U/L (ref 0–37)
Albumin: 5.2 g/dL (ref 3.5–5.2)
Alkaline Phosphatase: 60 U/L (ref 39–117)
BUN: 27 mg/dL — ABNORMAL HIGH (ref 6–23)
CO2: 28 mEq/L (ref 19–32)
Calcium: 10 mg/dL (ref 8.4–10.5)
Chloride: 101 mEq/L (ref 96–112)
Creatinine, Ser: 1.23 mg/dL (ref 0.40–1.50)
GFR: 64.19 mL/min (ref >60.00)
Glucose, Bld: 123 mg/dL — ABNORMAL HIGH (ref 70–99)
Potassium: 5.2 mEq/L — ABNORMAL HIGH (ref 3.5–5.1)
Sodium: 136 mEq/L (ref 135–145)
Total Bilirubin: 1.5 mg/dL — ABNORMAL HIGH (ref 0.2–1.2)
Total Protein: 7.7 g/dL (ref 6.0–8.3)

## 2021-10-26 LAB — LIPID PANEL
Cholesterol: 146 mg/dL (ref 0–200)
HDL: 49.9 mg/dL (ref >39.00)
LDL Cholesterol: 80 mg/dL (ref 0–99)
NonHDL: 95.82
Total CHOL/HDL Ratio: 3
Triglycerides: 79 mg/dL (ref 0.0–149.0)
VLDL: 15.8 mg/dL (ref 0.0–40.0)

## 2021-10-26 LAB — TSH: TSH: 4.9 u[IU]/mL (ref 0.35–5.50)

## 2021-10-26 LAB — TESTOSTERONE: Testosterone: 218.37 ng/dL — ABNORMAL LOW (ref 300.00–890.00)

## 2021-10-26 LAB — PSA: PSA: 2.71 ng/mL (ref 0.10–4.00)

## 2021-10-26 LAB — HEMOGLOBIN A1C: Hgb A1c MFr Bld: 6.3 % (ref 4.6–6.5)

## 2021-10-26 MED ORDER — FREESTYLE LIBRE 3 SENSOR MISC: 1.0000 | 3 refills | Status: DC

## 2021-10-26 MED ORDER — LEVOTHYROXINE SODIUM 75 MCG PO TABS: 75.0000 ug | ORAL_TABLET | Freq: Every day | ORAL | 3 refills | Status: DC

## 2021-10-26 NOTE — Progress Notes (Signed)
Subjective:    Patient ID: William Rosales, male    DOB: Dec 15, 1960, 60 y.o.   MRN: [id number]  HPI Patient presents for yearly preventative medicine examination. He is a pleasant 60 year old male who  has a past medical history of ADD (attention deficit disorder with hyperactivity), Dyslipidemia, Hyperlipidemia, Hypertension, and Hypothyroidism.  Hypothyroidism-takes Synthroid 75 mcg daily.  He does feel well controlled on this dose Lab Results  Component Value Date   TSH 4.67 (H) 10/22/2020   Hypertension-takes lisinopril 30 mg daily.  He does monitor his blood pressures at home periodically and reports readings in the 120s over 70s.  He denies headache, blurred vision, chest pain, shortness of breath, or lightheadedness  BP Readings from Last 3 Encounters:  10/26/21 100/78  05/11/21 (!) 150/74  02/22/21 130/73    Dyslipidemia-currently prescribed Lipitor 10 mg.  He is monitored by cardiology once a year due to family history of cardiac disease Lab Results  Component Value Date   CHOL 190 10/22/2020   HDL 56 10/22/2020   LDLCALC 110 (H) 10/22/2020   LDLDIRECT 134.9 08/25/2009   TRIG 129 10/22/2020   CHOLHDL 3.4 10/22/2020   BPH-well controlled with Flomax 0.4 mg.  Glucose Intolerance -had a single A1c above 6.5 last year with a reading of 6.8.  He was encouraged to start metformin 500 mg twice daily.  He was also advised to follow-up in 3 months to see how he is doing.  He did to work on lifestyle modifications before starting medication.  Unfortunately he never showed up for 18-month follow-up. He has been monitoring his blood sugars at home and reports readings in the 130-150's fasting. During the day his blood sugars decrease into the 90-102. He is working on weight loss through diet and exercise. He is interested in using [facility] system to monitor his BS Lab Results  Component Value Date   HGBA1C 6.8 (H) 10/22/2020   Low Testosterone -would like to check his  testosterone today just to get a baseline.  Feels as though he does not have the energy that he once did.  No loss of sexual desire.   All immunizations and health maintenance protocols were reviewed with the patient and needed orders were placed.  Appropriate screening laboratory values were ordered for the patient including screening of hyperlipidemia, renal function and hepatic function. If indicated by BPH, a PSA was ordered.  Medication reconciliation,  past medical history, social history, problem list and allergies were reviewed in detail with the patient  Goals were established with regard to weight loss, exercise, and  diet in compliance with medications. He is down 14 pounds since his last CPE   Wt Readings from Last 10 Encounters:  10/26/21 248 lb (112.5 kg)  05/11/21 256 lb 4.8 oz (116.3 kg)  02/22/21 264 lb (119.7 kg)  02/15/21 264 lb (119.7 kg)  10/22/20 262 lb 12.8 oz (119.2 kg)  03/22/20 232 lb (105.2 kg)  08/08/19 245 lb (111.1 kg)  08/27/18 232 lb 12.8 oz (105.6 kg)  07/14/18 235 lb 0.2 oz (106.6 kg)  05/02/18 235 lb (106.6 kg)    Review of Systems  Constitutional: Negative.   HENT: Negative.    Eyes: Negative.   Respiratory: Negative.    Cardiovascular: Negative.   Gastrointestinal: Negative.   Endocrine: Negative.   Genitourinary: Negative.   Musculoskeletal: Negative.   Skin: Negative.   Allergic/Immunologic: Negative.   Neurological: Negative.   Hematological: Negative.   Psychiatric/Behavioral: Negative.  All other systems reviewed and are negative.  Past Medical History:  Diagnosis Date   ADD (attention deficit disorder with hyperactivity)    Dyslipidemia    Hyperlipidemia    on meds   Hypertension    on meds   Hypothyroidism    on meds    Social History   Socioeconomic History   Marital status: Married    Spouse name: Not on file   Number of children: Not on file   Years of education: Not on file   Highest education level: Not on  file  Occupational History   Occupation: Pensions consultant  Tobacco Use   Smoking status: Former    Packs/day: 1.00    Years: 18.00    Pack years: 18.00    Types: Cigarettes    Quit date: 02/21/1996    Years since quitting: 25.6   Smokeless tobacco: Never  Vaping Use   Vaping Use: Never used  Substance and Sexual Activity   Alcohol use: Yes    Alcohol/week: 7.0 standard drinks    Types: 7 Standard drinks or equivalent per week   Drug use: No   Sexual activity: Not on file  Other Topics Concern   Not on file  Social History Narrative   Not on file   Social Determinants of Health   Financial Resource Strain: Not on file  Food Insecurity: Not on file  Transportation Needs: Not on file  Physical Activity: Not on file  Stress: Not on file  Social Connections: Not on file  Intimate Partner Violence: Not on file    Past Surgical History:  Procedure Laterality Date   COLONOSCOPY  2010   CG-F/V-movi(exc)-normal-hems   PRE-MALIGNANT / BENIGN SKIN LESION EXCISION     abdomen    WISDOM TOOTH EXTRACTION      Family History  Problem Relation Age of Onset   Colon polyps Father 49   Stroke Father 15   Non-Hodgkin's lymphoma Father 4   Lung cancer Maternal Uncle 45   Hypertension Other    Colon cancer Neg Hx    Esophageal cancer Neg Hx    Stomach cancer Neg Hx    Rectal cancer Neg Hx     No Known Allergies  Current Outpatient Medications on File Prior to Visit  Medication Sig Dispense Refill   atorvastatin (LIPITOR) 10 MG tablet TAKE 1 TABLET BY MOUTH EVERY DAY 90 tablet 3   benzonatate (TESSALON PERLES) 100 MG capsule Take 1 capsule (100 mg total) by mouth 3 (three) times daily as needed for cough. 30 capsule 0   Coenzyme Q10 100 MG capsule Take 100 mg by mouth daily.     levothyroxine (SYNTHROID) 75 MCG tablet TAKE 1 TABLET BY MOUTH EVERY DAY 30 tablet 0   lisinopril (ZESTRIL) 30 MG tablet TAKE 1 TABLET BY MOUTH EVERY DAY 90 tablet 3   tamsulosin (FLOMAX)  0.4 MG CAPS capsule Take 1 capsule (0.4 mg total) by mouth daily. 90 capsule 3   No current facility-administered medications on file prior to visit.    BP 100/78   Pulse 78   Temp 98.6 F (37 C) (Oral)   Ht 5' 10.75" (1.797 m)   Wt 248 lb (112.5 kg)   SpO2 98%   BMI 34.83 kg/m        Objective:   Physical Exam Vitals and nursing note reviewed.  Constitutional:      General: He is not in acute distress.    Appearance: Normal appearance. He  is well-developed. He is obese.  HENT:     Head: Normocephalic and atraumatic.     Right Ear: Tympanic membrane, ear canal and external ear normal. There is no impacted cerumen.     Left Ear: Tympanic membrane, ear canal and external ear normal. There is no impacted cerumen.     Nose: Nose normal. No congestion or rhinorrhea.     Mouth/Throat:     Mouth: Mucous membranes are moist.     Pharynx: Oropharynx is clear. No oropharyngeal exudate or posterior oropharyngeal erythema.  Eyes:     General:        Right eye: No discharge.        Left eye: No discharge.     Extraocular Movements: Extraocular movements intact.     Conjunctiva/sclera: Conjunctivae normal.     Pupils: Pupils are equal, round, and reactive to light.  Neck:     Vascular: No carotid bruit.     Trachea: No tracheal deviation.  Cardiovascular:     Rate and Rhythm: Normal rate and regular rhythm.     Pulses: Normal pulses.     Heart sounds: Normal heart sounds. No murmur heard.   No friction rub. No gallop.  Pulmonary:     Effort: Pulmonary effort is normal. No respiratory distress.     Breath sounds: Normal breath sounds. No stridor. No wheezing, rhonchi or rales.  Chest:     Chest wall: No tenderness.  Abdominal:     General: Bowel sounds are normal. There is no distension.     Palpations: Abdomen is soft. There is no mass.     Tenderness: There is no abdominal tenderness. There is no right CVA tenderness, left CVA tenderness, guarding or rebound.     Hernia: No  hernia is present.  Musculoskeletal:        General: No swelling, tenderness, deformity or signs of injury. Normal range of motion.     Right lower leg: No edema.     Left lower leg: No edema.  Lymphadenopathy:     Cervical: No cervical adenopathy.  Skin:    General: Skin is warm and dry.     Capillary Refill: Capillary refill takes less than 2 seconds.     Coloration: Skin is not jaundiced or pale.     Findings: No bruising, erythema, lesion or rash.  Neurological:     General: No focal deficit present.     Mental Status: He is alert and oriented to person, place, and time.     Cranial Nerves: No cranial nerve deficit.     Sensory: No sensory deficit.     Motor: No weakness.     Coordination: Coordination normal.     Gait: Gait normal.     Deep Tendon Reflexes: Reflexes normal.  Psychiatric:        Mood and Affect: Mood normal.        Behavior: Behavior normal.        Thought Content: Thought content normal.        Judgment: Judgment normal.       Assessment & Plan:  1. Encounter for general adult medical examination with abnormal findings - Continue with weight loss meaures - Follow up in one year or sooner if needed - CBC with Differential/Platelet; Future - Comprehensive metabolic panel; Future - Hemoglobin A1c; Future - Lipid panel; Future - TSH; Future - Testosterone; Future - Testosterone - TSH - Lipid panel - Hemoglobin A1c - Comprehensive metabolic panel -  CBC with Differential/Platelet  2. Essential hypertension - Well controlled. No change in medications  - CBC with Differential/Platelet; Future - Comprehensive metabolic panel; Future - Hemoglobin A1c; Future - Lipid panel; Future - TSH; Future - TSH - Lipid panel - Hemoglobin A1c - Comprehensive metabolic panel - CBC with Differential/Platelet  3. Hypothyroidism, unspecified type - Consider increasing synthroid  - CBC with Differential/Platelet; Future - Comprehensive metabolic panel;  Future - Hemoglobin A1c; Future - Lipid panel; Future - TSH; Future - TSH - Lipid panel - Hemoglobin A1c - Comprehensive metabolic panel - CBC with Differential/Platelet  4. Dyslipidemia - Consider increasing statin  - CBC with Differential/Platelet; Future - Comprehensive metabolic panel; Future - Hemoglobin A1c; Future - Lipid panel; Future - TSH; Future - TSH - Lipid panel - Hemoglobin A1c - Comprehensive metabolic panel - CBC with Differential/Platelet  5. Benign prostatic hyperplasia without lower urinary tract symptoms  - PSA; Future - PSA  6. Morbid obesity (HCC) - Congratulated on weight loss - CBC with Differential/Platelet; Future - Comprehensive metabolic panel; Future - Hemoglobin A1c; Future - Lipid panel; Future - TSH; Future - TSH - Lipid panel - Hemoglobin A1c - Comprehensive metabolic panel - CBC with Differential/Platelet  7. Glucose intolerance - Consider adding agent.  - CBC with Differential/Platelet; Future - Comprehensive metabolic panel; Future - Hemoglobin A1c; Future - Lipid panel; Future - TSH; Future - Continuous Blood Gluc Sensor (FREESTYLE LIBRE 3 SENSOR) MISC; 1 Device by Does not apply route every 14 (fourteen) days. Place 1 sensor on the skin every 14 days. Use to check glucose continuously  Dispense: 2 each; Refill: 3 - TSH - Lipid panel - Hemoglobin A1c - Comprehensive metabolic panel - CBC with Differential/Platelet  8. Need for Tdap vaccination  - Tdap vaccine greater than or equal to 7yo IM  9. Need for immunization against influenza  - Flu Vaccine QUAD 68mo+IM (Fluarix, Fluzone & Alfiuria Quad PF)  Shirline Frees, NP

## 2021-10-26 NOTE — Patient Instructions (Signed)
It was great seeing you today   We will follow up with you regarding your lab work   I will let you know a follow up plan after I review your blood work   Continue to work on weight loss through diet and exercise

## 2021-10-26 NOTE — Telephone Encounter (Signed)
Updated patient on his labs.   Testosterone is low. He would like to be referred to urology for treatment

## 2021-10-28 ENCOUNTER — Telehealth: Payer: BC Managed Care – PPO | Admitting: Nurse Practitioner

## 2021-10-28 DIAGNOSIS — J111 Influenza due to unidentified influenza virus with other respiratory manifestations: Secondary | ICD-10-CM | POA: Diagnosis not present

## 2021-10-28 MED ORDER — OSELTAMIVIR PHOSPHATE 75 MG PO CAPS: 75.0000 mg | ORAL_CAPSULE | Freq: Two times a day (BID) | ORAL | 0 refills | Status: AC

## 2021-10-28 NOTE — Progress Notes (Signed)
E visit for Flu like symptoms   We are sorry that you are not feeling well.  Here is how we plan to help! Based on what you have shared with me it looks like you may have a respiratory virus that may be influenza.  Influenza or "the flu" is   an infection caused by a respiratory virus. The flu virus is highly contagious and persons who did not receive their yearly flu vaccination may "catch" the flu from close contact.  We have anti-viral medications to treat the viruses that cause this infection. They are not a "cure" and only shorten the course of the infection. These prescriptions are most effective when they are given within the first 2 days of "flu" symptoms. Antiviral medication are indicated if you have a high risk of complications from the flu. You should  also consider an antiviral medication if you are in close contact with someone who is at risk. These medications can help patients avoid complications from the flu  but have side effects that you should know. Possible side effects from Tamiflu or oseltamivir include nausea, vomiting, diarrhea, dizziness, headaches, eye redness, sleep problems or other respiratory symptoms. You should not take Tamiflu if you have an allergy to oseltamivir or any to the ingredients in Tamiflu.  Based upon your symptoms and potential risk factors I have prescribed Oseltamivir (Tamiflu).  It has been sent to your designated pharmacy.  You will take one 75 mg capsule orally twice a day for the next 5 days.  ANYONE WHO HAS FLU SYMPTOMS SHOULD: Stay home. The flu is highly contagious and going out or to work exposes others! Be sure to drink plenty of fluids. Water is fine as well as fruit juices, sodas and electrolyte beverages. You may want to stay away from caffeine or alcohol. If you are nauseated, try taking small sips of liquids. How do you know if you are getting enough fluid? Your urine should be a pale yellow or almost colorless. Get rest. Taking a steamy  shower or using a humidifier may help nasal congestion and ease sore throat pain. Using a saline nasal spray works much the same way. Cough drops, hard candies and sore throat lozenges may ease your cough. Line up a caregiver. Have someone check on you regularly.   GET HELP RIGHT AWAY IF: You cannot keep down liquids or your medications. You become short of breath Your fell like you are going to pass out or loose consciousness. Your symptoms persist after you have completed your treatment plan MAKE SURE YOU  Understand these instructions. Will watch your condition. Will get help right away if you are not doing well or get worse.  Your e-visit answers were reviewed by a board certified advanced clinical practitioner to complete your personal care plan.  Depending on the condition, your plan could have included both over the counter or prescription medications.  If there is a problem please reply  once you have received a response from your provider.  Your safety is important to us.  If you have drug allergies check your prescription carefully.    You can use MyChart to ask questions about today's visit, request a non-urgent call back, or ask for a work or school excuse for 24 hours related to this e-Visit. If it has been greater than 24 hours you will need to follow up with your provider, or enter a new e-Visit to address those concerns.  You will get an e-mail in the next   two days asking about your experience.  I hope that your e-visit has been valuable and will speed your recovery. Thank you for using e-visits.   Meds ordered this encounter  Medications   oseltamivir (TAMIFLU) 75 MG capsule    Sig: Take 1 capsule (75 mg total) by mouth 2 (two) times daily for 5 days.    Dispense:  10 capsule    Refill:  0     I spent approximately 7 minutes reviewing the patient's history, current symptoms and coordinating their plan of care today.   

## 2021-12-07 DIAGNOSIS — R350 Frequency of micturition: Secondary | ICD-10-CM | POA: Diagnosis not present

## 2021-12-07 DIAGNOSIS — R351 Nocturia: Secondary | ICD-10-CM | POA: Diagnosis not present

## 2021-12-22 DIAGNOSIS — E291 Testicular hypofunction: Secondary | ICD-10-CM | POA: Diagnosis not present

## 2021-12-29 DIAGNOSIS — E291 Testicular hypofunction: Secondary | ICD-10-CM | POA: Diagnosis not present

## 2022-03-08 DIAGNOSIS — H5202 Hypermetropia, left eye: Secondary | ICD-10-CM | POA: Diagnosis not present

## 2022-09-29 DIAGNOSIS — L821 Other seborrheic keratosis: Secondary | ICD-10-CM | POA: Diagnosis not present

## 2022-09-29 DIAGNOSIS — D1801 Hemangioma of skin and subcutaneous tissue: Secondary | ICD-10-CM | POA: Diagnosis not present

## 2022-09-29 DIAGNOSIS — L814 Other melanin hyperpigmentation: Secondary | ICD-10-CM | POA: Diagnosis not present

## 2022-09-29 DIAGNOSIS — Z789 Other specified health status: Secondary | ICD-10-CM | POA: Diagnosis not present

## 2022-09-29 DIAGNOSIS — L298 Other pruritus: Secondary | ICD-10-CM | POA: Diagnosis not present

## 2022-09-29 DIAGNOSIS — L538 Other specified erythematous conditions: Secondary | ICD-10-CM | POA: Diagnosis not present

## 2022-09-29 DIAGNOSIS — L820 Inflamed seborrheic keratosis: Secondary | ICD-10-CM | POA: Diagnosis not present

## 2022-10-07 ENCOUNTER — Other Ambulatory Visit: Payer: Self-pay | Admitting: Adult Health

## 2022-10-07 DIAGNOSIS — E785 Hyperlipidemia, unspecified: Secondary | ICD-10-CM

## 2022-10-24 ENCOUNTER — Other Ambulatory Visit: Payer: Self-pay | Admitting: Adult Health

## 2022-10-24 DIAGNOSIS — I10 Essential (primary) hypertension: Secondary | ICD-10-CM

## 2022-10-24 DIAGNOSIS — E039 Hypothyroidism, unspecified: Secondary | ICD-10-CM

## 2022-11-02 ENCOUNTER — Ambulatory Visit (INDEPENDENT_AMBULATORY_CARE_PROVIDER_SITE_OTHER): Payer: BC Managed Care – PPO | Admitting: Adult Health

## 2022-11-02 ENCOUNTER — Encounter: Payer: Self-pay | Admitting: Adult Health

## 2022-11-02 VITALS — BP 110/80 | HR 53 | Temp 97.5°F | Wt 202.0 lb

## 2022-11-02 DIAGNOSIS — E039 Hypothyroidism, unspecified: Secondary | ICD-10-CM

## 2022-11-02 DIAGNOSIS — Z23 Encounter for immunization: Secondary | ICD-10-CM

## 2022-11-02 DIAGNOSIS — E7439 Other disorders of intestinal carbohydrate absorption: Secondary | ICD-10-CM

## 2022-11-02 DIAGNOSIS — E785 Hyperlipidemia, unspecified: Secondary | ICD-10-CM

## 2022-11-02 DIAGNOSIS — N400 Enlarged prostate without lower urinary tract symptoms: Secondary | ICD-10-CM | POA: Diagnosis not present

## 2022-11-02 DIAGNOSIS — I10 Essential (primary) hypertension: Secondary | ICD-10-CM

## 2022-11-02 DIAGNOSIS — Z0000 Encounter for general adult medical examination without abnormal findings: Secondary | ICD-10-CM

## 2022-11-02 DIAGNOSIS — R079 Chest pain, unspecified: Secondary | ICD-10-CM

## 2022-11-02 LAB — CBC WITH DIFFERENTIAL/PLATELET
Basophils Absolute: 0 10*3/uL (ref 0.0–0.1)
Basophils Relative: 0.3 % (ref 0.0–3.0)
Eosinophils Absolute: 0.1 10*3/uL (ref 0.0–0.7)
Eosinophils Relative: 1.1 % (ref 0.0–5.0)
HCT: 44 % (ref 39.0–52.0)
Hemoglobin: 14.6 g/dL (ref 13.0–17.0)
Lymphocytes Relative: 30.4 % (ref 12.0–46.0)
Lymphs Abs: 2.1 10*3/uL (ref 0.7–4.0)
MCHC: 33.3 g/dL (ref 30.0–36.0)
MCV: 87.8 fl (ref 78.0–100.0)
Monocytes Absolute: 0.7 10*3/uL (ref 0.1–1.0)
Monocytes Relative: 9.6 % (ref 3.0–12.0)
Neutro Abs: 4 10*3/uL (ref 1.4–7.7)
Neutrophils Relative %: 58.6 % (ref 43.0–77.0)
Platelets: 224 10*3/uL (ref 150.0–400.0)
RBC: 5.01 Mil/uL (ref 4.22–5.81)
RDW: 13.5 % (ref 11.5–15.5)
WBC: 6.8 10*3/uL (ref 4.0–10.5)

## 2022-11-02 LAB — COMPREHENSIVE METABOLIC PANEL
ALT: 20 U/L (ref 0–53)
AST: 22 U/L (ref 0–37)
Albumin: 4.7 g/dL (ref 3.5–5.2)
Alkaline Phosphatase: 50 U/L (ref 39–117)
BUN: 27 mg/dL — ABNORMAL HIGH (ref 6–23)
CO2: 28 mEq/L (ref 19–32)
Calcium: 9.5 mg/dL (ref 8.4–10.5)
Chloride: 103 mEq/L (ref 96–112)
Creatinine, Ser: 0.99 mg/dL (ref 0.40–1.50)
GFR: 82.7 mL/min (ref >60.00)
Glucose, Bld: 101 mg/dL — ABNORMAL HIGH (ref 70–99)
Potassium: 4.9 mEq/L (ref 3.5–5.1)
Sodium: 137 mEq/L (ref 135–145)
Total Bilirubin: 1.5 mg/dL — ABNORMAL HIGH (ref 0.2–1.2)
Total Protein: 7 g/dL (ref 6.0–8.3)

## 2022-11-02 LAB — LIPID PANEL
Cholesterol: 158 mg/dL (ref 0–200)
HDL: 67.5 mg/dL (ref >39.00)
LDL Cholesterol: 80 mg/dL (ref 0–99)
NonHDL: 90.66
Total CHOL/HDL Ratio: 2
Triglycerides: 54 mg/dL (ref 0.0–149.0)
VLDL: 10.8 mg/dL (ref 0.0–40.0)

## 2022-11-02 LAB — PSA: PSA: 2.48 ng/mL (ref 0.10–4.00)

## 2022-11-02 LAB — TSH: TSH: 4.36 u[IU]/mL (ref 0.35–5.50)

## 2022-11-02 LAB — HEMOGLOBIN A1C: Hgb A1c MFr Bld: 5.7 % (ref 4.6–6.5)

## 2022-11-02 MED ORDER — LISINOPRIL 10 MG PO TABS: 10.0000 mg | ORAL_TABLET | Freq: Every day | ORAL | 3 refills | Status: DC

## 2022-11-02 NOTE — Patient Instructions (Signed)
It was great seeing you today   We will follow up with you regarding your lab work   Please let me know if you need anything   

## 2022-11-02 NOTE — Progress Notes (Signed)
Subjective:    Patient ID: William Rosales, male    DOB: 1961/10/08, 61 y.o.   MRN: [id number]  HPI Patient presents for yearly preventative medicine examination. He is a pleasant 61 year old male who  has a past medical history of ADD (attention deficit disorder with hyperactivity), Dyslipidemia, Hyperlipidemia, Hypertension, and Hypothyroidism.  Hypertension-managed with lisinopril 30 mg daily.  He has done extensive lifestyle modifications and has been able to lose about 46 pounds. He has been experiencing some intermittent dizziness and lightheadedness.  BP Readings from Last 3 Encounters:  11/02/22 110/80  10/26/21 100/78  05/11/21 (!) 150/74   Dyslipidemia-managed with Lipitor 10 mg daily.  He is monitored by cardiology yearly due to family history of cardiac disease.  He denies myalgia or fatigue with Lipitor Lab Results  Component Value Date   CHOL 146 10/26/2021   HDL 49.90 10/26/2021   LDLCALC 80 10/26/2021   LDLDIRECT 134.9 08/25/2009   TRIG 79.0 10/26/2021   CHOLHDL 3 10/26/2021   Hypothyroidism-managed with Synthroid 75 mcg daily.  He does feel well controlled on this dose  BPH-symptoms controlled with Flomax 0.4 mg- managed   Glucose Intolerance - has refused medication in the past and opted for lifestyle modifications  Lab Results  Component Value Date   HGBA1C 6.3 10/26/2021    Chest Pain - he reports that when he is working out hard and running he will get a pain in his chest located on the mid left side. This pain lasts about an hour and then resolves. No associated shortness of breath. He does not have any pain with rest. He plans to see his Cardiologist about this    All immunizations and health maintenance protocols were reviewed with the patient and needed orders were placed.  Appropriate screening laboratory values were ordered for the patient including screening of hyperlipidemia, renal function and hepatic function. If indicated by BPH, a PSA was  ordered.  Medication reconciliation,  past medical history, social history, problem list and allergies were reviewed in detail with the patient  Goals were established with regard to weight loss, exercise, and  diet in compliance with medications. He has been going to the gym every morning, doing intermittent fasting and eating healthy.  Wt Readings from Last 3 Encounters:  11/02/22 202 lb (91.6 kg)  10/26/21 248 lb (112.5 kg)  05/11/21 256 lb 4.8 oz (116.3 kg)   He is up to date on routine colon cancer screening    Review of Systems  Constitutional: Negative.   HENT: Negative.    Eyes: Negative.   Respiratory: Negative.    Cardiovascular:  Positive for chest pain.  Gastrointestinal: Negative.   Endocrine: Negative.   Genitourinary: Negative.   Musculoskeletal: Negative.   Skin: Negative.   Allergic/Immunologic: Negative.   Neurological: Negative.   Hematological: Negative.   Psychiatric/Behavioral: Negative.    All other systems reviewed and are negative.  Past Medical History:  Diagnosis Date   ADD (attention deficit disorder with hyperactivity)    Dyslipidemia    Hyperlipidemia    on meds   Hypertension    on meds   Hypothyroidism    on meds    Social History   Socioeconomic History   Marital status: Married    Spouse name: Not on file   Number of children: Not on file   Years of education: Not on file   Highest education level: Not on file  Occupational History   Occupation: Pensions consultantcommunications technician  Tobacco Use   Smoking status: Former    Packs/day: 1.00    Years: 18.00    Total pack years: 18.00    Types: Cigarettes    Quit date: 02/21/1996    Years since quitting: 26.7   Smokeless tobacco: Never  Vaping Use   Vaping Use: Never used  Substance and Sexual Activity   Alcohol use: Yes    Alcohol/week: 7.0 standard drinks of alcohol    Types: 7 Standard drinks or equivalent per week   Drug use: No   Sexual activity: Not on file  Other Topics  Concern   Not on file  Social History Narrative   Not on file   Social Determinants of Health   Financial Resource Strain: Not on file  Food Insecurity: Not on file  Transportation Needs: Not on file  Physical Activity: Not on file  Stress: Not on file  Social Connections: Not on file  Intimate Partner Violence: Not on file    Past Surgical History:  Procedure Laterality Date   COLONOSCOPY  2010   CG-F/V-movi(exc)-normal-hems   PRE-MALIGNANT / BENIGN SKIN LESION EXCISION     abdomen    WISDOM TOOTH EXTRACTION      Family History  Problem Relation Age of Onset   Colon polyps Father 28   Stroke Father 69   Non-Hodgkin's lymphoma Father 79   Lung cancer Maternal Uncle 45   Hypertension Other    Colon cancer Neg Hx    Esophageal cancer Neg Hx    Stomach cancer Neg Hx    Rectal cancer Neg Hx     No Known Allergies  Current Outpatient Medications on File Prior to Visit  Medication Sig Dispense Refill   atorvastatin (LIPITOR) 10 MG tablet TAKE 1 TABLET BY MOUTH EVERY DAY 90 tablet 0   benzonatate (TESSALON PERLES) 100 MG capsule Take 1 capsule (100 mg total) by mouth 3 (three) times daily as needed for cough. 30 capsule 0   Coenzyme Q10 100 MG capsule Take 100 mg by mouth daily.     Continuous Blood Gluc Sensor (FREESTYLE LIBRE 3 SENSOR) MISC 1 Device by Does not apply route every 14 (fourteen) days. Place 1 sensor on the skin every 14 days. Use to check glucose continuously 2 each 3   levothyroxine (SYNTHROID) 75 MCG tablet TAKE 1 TABLET BY MOUTH EVERY DAY 90 tablet 3   lisinopril (ZESTRIL) 30 MG tablet TAKE 1 TABLET BY MOUTH EVERY DAY 90 tablet 3   tamsulosin (FLOMAX) 0.4 MG CAPS capsule Take 1 capsule (0.4 mg total) by mouth daily. 90 capsule 3   No current facility-administered medications on file prior to visit.    BP 110/80   Pulse (!) 53   Temp (!) 97.5 F (36.4 C) (Oral)   Wt 202 lb (91.6 kg)   SpO2 99%   BMI 28.37 kg/m       Objective:   Physical  Exam Vitals and nursing note reviewed.  Constitutional:      General: He is not in acute distress.    Appearance: Normal appearance. He is well-developed and normal weight.  HENT:     Head: Normocephalic and atraumatic.     Right Ear: Tympanic membrane, ear canal and external ear normal. There is no impacted cerumen.     Left Ear: Tympanic membrane, ear canal and external ear normal. There is no impacted cerumen.     Nose: Nose normal. No congestion or rhinorrhea.     Mouth/Throat:  Mouth: Mucous membranes are moist.     Pharynx: Oropharynx is clear. No oropharyngeal exudate or posterior oropharyngeal erythema.  Eyes:     General:        Right eye: No discharge.        Left eye: No discharge.     Extraocular Movements: Extraocular movements intact.     Conjunctiva/sclera: Conjunctivae normal.     Pupils: Pupils are equal, round, and reactive to light.  Neck:     Vascular: No carotid bruit.     Trachea: No tracheal deviation.  Cardiovascular:     Rate and Rhythm: Normal rate and regular rhythm.     Pulses: Normal pulses.     Heart sounds: Normal heart sounds. No murmur heard.    No friction rub. No gallop.  Pulmonary:     Effort: Pulmonary effort is normal. No respiratory distress.     Breath sounds: Normal breath sounds. No stridor. No wheezing, rhonchi or rales.  Chest:     Chest wall: No tenderness.  Abdominal:     General: Bowel sounds are normal. There is no distension.     Palpations: Abdomen is soft. There is no mass.     Tenderness: There is no abdominal tenderness. There is no right CVA tenderness, left CVA tenderness, guarding or rebound.     Hernia: No hernia is present.  Musculoskeletal:        General: No swelling, tenderness, deformity or signs of injury. Normal range of motion.     Right lower leg: No edema.     Left lower leg: No edema.  Lymphadenopathy:     Cervical: No cervical adenopathy.  Skin:    General: Skin is warm and dry.     Capillary Refill:  Capillary refill takes less than 2 seconds.     Coloration: Skin is not jaundiced or pale.     Findings: No bruising, erythema, lesion or rash.  Neurological:     General: No focal deficit present.     Mental Status: He is alert and oriented to person, place, and time.     Cranial Nerves: No cranial nerve deficit.     Sensory: No sensory deficit.     Motor: No weakness.     Coordination: Coordination normal.     Gait: Gait normal.     Deep Tendon Reflexes: Reflexes normal.  Psychiatric:        Mood and Affect: Mood normal.        Behavior: Behavior normal.        Thought Content: Thought content normal.        Judgment: Judgment normal.       Assessment & Plan:  1. Routine general medical examination at a health care facility - He has made extensive lifestyle modifications. I am very proud of him.  - Follow up in one year or sooner if needed - CBC with Differential/Platelet; Future - Comprehensive metabolic panel; Future - Hemoglobin A1c; Future - Lipid panel; Future - TSH; Future  2. Hypothyroidism, unspecified type - Consider dose change of synthroid  - CBC with Differential/Platelet; Future - Comprehensive metabolic panel; Future - Hemoglobin A1c; Future - Lipid panel; Future - TSH; Future  3. Essential hypertension - Will decrease lisinopril to 10 mg. He will keep an eye on his BP at home.  - CBC with Differential/Platelet; Future - Comprehensive metabolic panel; Future - Hemoglobin A1c; Future - Lipid panel; Future - TSH; Future - lisinopril (ZESTRIL) 10 MG tablet;  Take 1 tablet (10 mg total) by mouth daily.  Dispense: 90 tablet; Refill: 3  4. Dyslipidemia - Continue statin  - CBC with Differential/Platelet; Future - Comprehensive metabolic panel; Future - Hemoglobin A1c; Future - Lipid panel; Future - TSH; Future  5. Benign prostatic hyperplasia without lower urinary tract symptoms - Per urology; continue flomax  - PSA; Future  6. Glucose  intolerance - Likely resolved  - CBC with Differential/Platelet; Future - Comprehensive metabolic panel; Future - Hemoglobin A1c; Future - Lipid panel; Future - TSH; Future  7. Chest pain, unspecified type - Follow up with Cardiology   8. Need for immunization against influenza  - Flu Vaccine QUAD 71mo+IM (Fluarix, Fluzone & Alfiuria Quad PF)   Shirline Frees, NP

## 2022-11-02 NOTE — Addendum Note (Signed)
Addended by: Nancy Fetter on: 11/02/2022 03:15 PM   Modules accepted: Level of Service

## 2022-11-03 DIAGNOSIS — Z789 Other specified health status: Secondary | ICD-10-CM | POA: Diagnosis not present

## 2022-11-03 DIAGNOSIS — L538 Other specified erythematous conditions: Secondary | ICD-10-CM | POA: Diagnosis not present

## 2022-11-03 DIAGNOSIS — B078 Other viral warts: Secondary | ICD-10-CM | POA: Diagnosis not present

## 2022-11-03 DIAGNOSIS — R238 Other skin changes: Secondary | ICD-10-CM | POA: Diagnosis not present

## 2022-12-07 DIAGNOSIS — L538 Other specified erythematous conditions: Secondary | ICD-10-CM | POA: Diagnosis not present

## 2022-12-07 DIAGNOSIS — B078 Other viral warts: Secondary | ICD-10-CM | POA: Diagnosis not present

## 2022-12-07 DIAGNOSIS — R238 Other skin changes: Secondary | ICD-10-CM | POA: Diagnosis not present

## 2022-12-07 DIAGNOSIS — Z789 Other specified health status: Secondary | ICD-10-CM | POA: Diagnosis not present

## 2022-12-18 DIAGNOSIS — E291 Testicular hypofunction: Secondary | ICD-10-CM | POA: Diagnosis not present

## 2022-12-25 DIAGNOSIS — R3912 Poor urinary stream: Secondary | ICD-10-CM | POA: Diagnosis not present

## 2023-01-06 ENCOUNTER — Other Ambulatory Visit: Payer: Self-pay | Admitting: Adult Health

## 2023-01-06 DIAGNOSIS — E785 Hyperlipidemia, unspecified: Secondary | ICD-10-CM

## 2023-01-08 DIAGNOSIS — R238 Other skin changes: Secondary | ICD-10-CM | POA: Diagnosis not present

## 2023-01-08 DIAGNOSIS — L538 Other specified erythematous conditions: Secondary | ICD-10-CM | POA: Diagnosis not present

## 2023-01-08 DIAGNOSIS — B078 Other viral warts: Secondary | ICD-10-CM | POA: Diagnosis not present

## 2023-01-08 DIAGNOSIS — Z789 Other specified health status: Secondary | ICD-10-CM | POA: Diagnosis not present

## 2023-02-06 DIAGNOSIS — R238 Other skin changes: Secondary | ICD-10-CM | POA: Diagnosis not present

## 2023-02-06 DIAGNOSIS — B078 Other viral warts: Secondary | ICD-10-CM | POA: Diagnosis not present

## 2023-02-06 DIAGNOSIS — L538 Other specified erythematous conditions: Secondary | ICD-10-CM | POA: Diagnosis not present

## 2023-02-06 DIAGNOSIS — L298 Other pruritus: Secondary | ICD-10-CM | POA: Diagnosis not present

## 2023-02-14 DIAGNOSIS — L309 Dermatitis, unspecified: Secondary | ICD-10-CM | POA: Diagnosis not present

## 2023-03-09 DIAGNOSIS — L298 Other pruritus: Secondary | ICD-10-CM | POA: Diagnosis not present

## 2023-03-09 DIAGNOSIS — B078 Other viral warts: Secondary | ICD-10-CM | POA: Diagnosis not present

## 2023-03-09 DIAGNOSIS — R238 Other skin changes: Secondary | ICD-10-CM | POA: Diagnosis not present

## 2023-03-09 DIAGNOSIS — L538 Other specified erythematous conditions: Secondary | ICD-10-CM | POA: Diagnosis not present

## 2023-04-07 ENCOUNTER — Other Ambulatory Visit: Payer: Self-pay | Admitting: Adult Health

## 2023-04-07 DIAGNOSIS — E785 Hyperlipidemia, unspecified: Secondary | ICD-10-CM

## 2023-07-10 ENCOUNTER — Other Ambulatory Visit: Payer: Self-pay | Admitting: Adult Health

## 2023-07-10 DIAGNOSIS — E785 Hyperlipidemia, unspecified: Secondary | ICD-10-CM

## 2023-10-02 DIAGNOSIS — L821 Other seborrheic keratosis: Secondary | ICD-10-CM | POA: Diagnosis not present

## 2023-10-02 DIAGNOSIS — B078 Other viral warts: Secondary | ICD-10-CM | POA: Diagnosis not present

## 2023-10-02 DIAGNOSIS — L814 Other melanin hyperpigmentation: Secondary | ICD-10-CM | POA: Diagnosis not present

## 2023-10-02 DIAGNOSIS — D1801 Hemangioma of skin and subcutaneous tissue: Secondary | ICD-10-CM | POA: Diagnosis not present

## 2023-10-11 ENCOUNTER — Other Ambulatory Visit: Payer: Self-pay | Admitting: Adult Health

## 2023-10-11 DIAGNOSIS — E785 Hyperlipidemia, unspecified: Secondary | ICD-10-CM

## 2023-10-11 NOTE — Telephone Encounter (Signed)
Pt needs to schedule a CPE for further refills 

## 2023-10-16 ENCOUNTER — Other Ambulatory Visit: Payer: Self-pay | Admitting: Adult Health

## 2023-10-16 DIAGNOSIS — I10 Essential (primary) hypertension: Secondary | ICD-10-CM

## 2023-10-18 ENCOUNTER — Other Ambulatory Visit: Payer: Self-pay | Admitting: Adult Health

## 2023-10-18 DIAGNOSIS — E039 Hypothyroidism, unspecified: Secondary | ICD-10-CM

## 2023-11-11 ENCOUNTER — Other Ambulatory Visit: Payer: Self-pay | Admitting: Adult Health

## 2023-11-11 DIAGNOSIS — E785 Hyperlipidemia, unspecified: Secondary | ICD-10-CM

## 2023-11-14 ENCOUNTER — Other Ambulatory Visit: Payer: Self-pay | Admitting: Adult Health

## 2023-11-14 DIAGNOSIS — E039 Hypothyroidism, unspecified: Secondary | ICD-10-CM

## 2023-11-15 NOTE — Telephone Encounter (Signed)
 Patient need to schedule for more refills.

## 2023-11-21 ENCOUNTER — Ambulatory Visit: Payer: BC Managed Care – PPO | Admitting: Adult Health

## 2023-11-21 ENCOUNTER — Encounter: Payer: Self-pay | Admitting: Adult Health

## 2023-11-21 VITALS — BP 110/70 | HR 50 | Temp 97.5°F | Ht 70.5 in | Wt 208.0 lb

## 2023-11-21 DIAGNOSIS — N400 Enlarged prostate without lower urinary tract symptoms: Secondary | ICD-10-CM | POA: Diagnosis not present

## 2023-11-21 DIAGNOSIS — E785 Hyperlipidemia, unspecified: Secondary | ICD-10-CM

## 2023-11-21 DIAGNOSIS — R7989 Other specified abnormal findings of blood chemistry: Secondary | ICD-10-CM | POA: Diagnosis not present

## 2023-11-21 DIAGNOSIS — Z0000 Encounter for general adult medical examination without abnormal findings: Secondary | ICD-10-CM

## 2023-11-21 DIAGNOSIS — Z23 Encounter for immunization: Secondary | ICD-10-CM

## 2023-11-21 DIAGNOSIS — E7439 Other disorders of intestinal carbohydrate absorption: Secondary | ICD-10-CM

## 2023-11-21 DIAGNOSIS — E039 Hypothyroidism, unspecified: Secondary | ICD-10-CM

## 2023-11-21 DIAGNOSIS — I10 Essential (primary) hypertension: Secondary | ICD-10-CM | POA: Diagnosis not present

## 2023-11-21 LAB — CBC
HCT: 43.9 % (ref 39.0–52.0)
Hemoglobin: 14.6 g/dL (ref 13.0–17.0)
MCHC: 33.4 g/dL (ref 30.0–36.0)
MCV: 88.6 fL (ref 78.0–100.0)
Platelets: 227 10*3/uL (ref 150.0–400.0)
RBC: 4.96 Mil/uL (ref 4.22–5.81)
RDW: 13.4 % (ref 11.5–15.5)
WBC: 6.8 10*3/uL (ref 4.0–10.5)

## 2023-11-21 LAB — COMPREHENSIVE METABOLIC PANEL
ALT: 25 U/L (ref 0–53)
AST: 21 U/L (ref 0–37)
Albumin: 4.7 g/dL (ref 3.5–5.2)
Alkaline Phosphatase: 52 U/L (ref 39–117)
BUN: 28 mg/dL — ABNORMAL HIGH (ref 6–23)
CO2: 29 meq/L (ref 19–32)
Calcium: 9.3 mg/dL (ref 8.4–10.5)
Chloride: 102 meq/L (ref 96–112)
Creatinine, Ser: 1.05 mg/dL (ref 0.40–1.50)
GFR: 76.49 mL/min (ref >60.00)
Glucose, Bld: 112 mg/dL — ABNORMAL HIGH (ref 70–99)
Potassium: 4.4 meq/L (ref 3.5–5.1)
Sodium: 137 meq/L (ref 135–145)
Total Bilirubin: 1.5 mg/dL — ABNORMAL HIGH (ref 0.2–1.2)
Total Protein: 7 g/dL (ref 6.0–8.3)

## 2023-11-21 LAB — LIPID PANEL
Cholesterol: 167 mg/dL (ref 0–200)
HDL: 72.3 mg/dL (ref >39.00)
LDL Cholesterol: 85 mg/dL (ref 0–99)
NonHDL: 95.09
Total CHOL/HDL Ratio: 2
Triglycerides: 49 mg/dL (ref 0.0–149.0)
VLDL: 9.8 mg/dL (ref 0.0–40.0)

## 2023-11-21 LAB — TSH: TSH: 5.18 u[IU]/mL (ref 0.35–5.50)

## 2023-11-21 LAB — PSA: PSA: 1.75 ng/mL (ref 0.10–4.00)

## 2023-11-21 LAB — HEMOGLOBIN A1C: Hgb A1c MFr Bld: 5.8 % (ref 4.6–6.5)

## 2023-11-21 NOTE — Progress Notes (Signed)
Subjective:    Patient ID: William Rosales, male    DOB: 10/26/1961, 62 y.o.   MRN: [id number]  HPI Patient presents for yearly preventative medicine examination. He is a pleasant 62 year old male who  has a past medical history of ADD (attention deficit disorder with hyperactivity), Dyslipidemia, Hyperlipidemia, Hypertension, and Hypothyroidism.  Hypertension-managed with lisinopril 10 mg daily.  He has done extensive lifestyle modifications and has been able to lose about 46 pounds. He has been experiencing some intermittent dizziness and lightheadedness with change in positions. He does check his BP at home and routinely has readings in the 110 systolic range.  BP Readings from Last 3 Encounters:  11/21/23 110/70  11/02/22 110/80  10/26/21 100/78   Dyslipidemia-managed with Lipitor 10 mg daily.  He is monitored by cardiology yearly due to family history of cardiac disease.  He denies myalgia or fatigue with Lipitor Lab Results  Component Value Date   CHOL 158 11/02/2022   HDL 67.50 11/02/2022   LDLCALC 80 11/02/2022   LDLDIRECT 134.9 08/25/2009   TRIG 54.0 11/02/2022   CHOLHDL 2 11/02/2022   Hypothyroidism-managed with Synthroid 75 mcg daily.  He does feel well controlled on this dose Lab Results  Component Value Date   TSH 4.36 11/02/2022   BPH-symptoms controlled with Flomax 0.4 mg- managed   Glucose Intolerance - has refused medication in the past and opted for lifestyle modifications  Lab Results  Component Value Date   HGBA1C 5.7 11/02/2022   History of low testosterone - he would like to recheck this today   All immunizations and health maintenance protocols were reviewed with the patient and needed orders were placed. He will get shingles at a later date. Will get flu shot today.   Appropriate screening laboratory values were ordered for the patient including screening of hyperlipidemia, renal function and hepatic function. If indicated by BPH, a PSA was  ordered.  Medication reconciliation,  past medical history, social history, problem list and allergies were reviewed in detail with the patient  Goals were established with regard to weight loss, exercise, and  diet in compliance with medications. He continues to go to the gym every morning, does intermittent fasting and is eating healthy.  Wt Readings from Last 3 Encounters:  11/21/23 208 lb (94.3 kg)  11/02/22 202 lb (91.6 kg)  10/26/21 248 lb (112.5 kg)   He is up to date on routine colon cancer screening  He has no acute complaints   Review of Systems  Constitutional: Negative.   HENT: Negative.    Eyes: Negative.   Respiratory: Negative.    Cardiovascular: Negative.   Gastrointestinal: Negative.   Endocrine: Negative.   Genitourinary: Negative.   Musculoskeletal: Negative.   Skin: Negative.   Allergic/Immunologic: Negative.   Neurological: Negative.   Hematological: Negative.   Psychiatric/Behavioral: Negative.    All other systems reviewed and are negative.  Past Medical History:  Diagnosis Date   ADD (attention deficit disorder with hyperactivity)    Dyslipidemia    Hyperlipidemia    on meds   Hypertension    on meds   Hypothyroidism    on meds    Social History   Socioeconomic History   Marital status: Married    Spouse name: Not on file   Number of children: Not on file   Years of education: Not on file   Highest education level: Associate degree: occupational, Scientist, product/process development, or vocational program  Occupational History   Occupation: communications  technician  Tobacco Use   Smoking status: Former    Current packs/day: 0.00    Average packs/day: 1 pack/day for 18.0 years (18.0 ttl pk-yrs)    Types: Cigarettes    Start date: 02/20/1978    Quit date: 02/21/1996    Years since quitting: 27.7   Smokeless tobacco: Never  Vaping Use   Vaping status: Never Used  Substance and Sexual Activity   Alcohol use: Yes    Alcohol/week: 7.0 standard drinks of alcohol     Types: 7 Standard drinks or equivalent per week   Drug use: No   Sexual activity: Not on file  Other Topics Concern   Not on file  Social History Narrative   Not on file   Social Drivers of Health   Financial Resource Strain: Low Risk  (11/20/2023)   Overall Financial Resource Strain (CARDIA)    Difficulty of Paying Living Expenses: Not hard at all  Food Insecurity: No Food Insecurity (11/20/2023)   Hunger Vital Sign    Worried About Running Out of Food in the Last Year: Never true    Ran Out of Food in the Last Year: Never true  Transportation Needs: No Transportation Needs (11/20/2023)   PRAPARE - Administrator, Civil Service (Medical): No    Lack of Transportation (Non-Medical): No  Physical Activity: Sufficiently Active (11/20/2023)   Exercise Vital Sign    Days of Exercise per Week: 5 days    Minutes of Exercise per Session: 60 min  Stress: No Stress Concern Present (11/20/2023)   [employer] of Occupational Health - Occupational Stress Questionnaire    Feeling of Stress : Not at all  Social Connections: Moderately Isolated (11/20/2023)   Social Connection and Isolation Panel [NHANES]    Frequency of Communication with Friends and Family: Twice a week    Frequency of Social Gatherings with Friends and Family: More than three times a week    Attends Religious Services: Never    Database administrator or Organizations: No    Attends Engineer, structural: Not on file    Marital Status: Married  Catering manager Violence: Not on file    Past Surgical History:  Procedure Laterality Date   COLONOSCOPY  2010   CG-F/V-movi(exc)-normal-hems   PRE-MALIGNANT / BENIGN SKIN LESION EXCISION     abdomen    WISDOM TOOTH EXTRACTION      Family History  Problem Relation Age of Onset   Colon polyps Father 65   Stroke Father 72   Non-Hodgkin's lymphoma Father 3   Lung cancer Maternal Uncle 45   Hypertension Other    Colon cancer Neg Hx     Esophageal cancer Neg Hx    Stomach cancer Neg Hx    Rectal cancer Neg Hx     No Known Allergies  Current Outpatient Medications on File Prior to Visit  Medication Sig Dispense Refill   atorvastatin (LIPITOR) 10 MG tablet TAKE 1 TABLET BY MOUTH EVERY DAY 90 tablet 3   Coenzyme Q10 100 MG capsule Take 100 mg by mouth daily.     finasteride (PROSCAR) 5 MG tablet      levothyroxine (SYNTHROID) 75 MCG tablet TAKE 1 TABLET BY MOUTH EVERY DAY 30 tablet 0   lisinopril (ZESTRIL) 10 MG tablet TAKE 1 TABLET BY MOUTH EVERY DAY 90 tablet 3   tamsulosin (FLOMAX) 0.4 MG CAPS capsule Take 1 capsule (0.4 mg total) by mouth daily. 90 capsule 3   No  current facility-administered medications on file prior to visit.    BP 110/70   Pulse (!) 50   Temp (!) 97.5 F (36.4 C) (Oral)   Ht 5' 10.5" (1.791 m)   Wt 208 lb (94.3 kg)   SpO2 99%   BMI 29.42 kg/m       Objective:   Physical Exam Vitals and nursing note reviewed.  Constitutional:      General: He is not in acute distress.    Appearance: Normal appearance. He is not ill-appearing.  HENT:     Head: Normocephalic and atraumatic.     Right Ear: Tympanic membrane, ear canal and external ear normal. There is no impacted cerumen.     Left Ear: Tympanic membrane, ear canal and external ear normal. There is no impacted cerumen.     Nose: Nose normal. No congestion or rhinorrhea.     Mouth/Throat:     Mouth: Mucous membranes are moist.     Pharynx: Oropharynx is clear.  Eyes:     Extraocular Movements: Extraocular movements intact.     Conjunctiva/sclera: Conjunctivae normal.     Pupils: Pupils are equal, round, and reactive to light.  Neck:     Vascular: No carotid bruit.  Cardiovascular:     Rate and Rhythm: Normal rate and regular rhythm.     Pulses: Normal pulses.     Heart sounds: No murmur heard.    No friction rub. No gallop.  Pulmonary:     Effort: Pulmonary effort is normal.     Breath sounds: Normal breath sounds.   Abdominal:     General: Abdomen is flat. Bowel sounds are normal. There is no distension.     Palpations: Abdomen is soft. There is no mass.     Tenderness: There is no abdominal tenderness. There is no guarding or rebound.     Hernia: No hernia is present.  Musculoskeletal:        General: Normal range of motion.     Cervical back: Normal range of motion and neck supple.  Lymphadenopathy:     Cervical: No cervical adenopathy.  Skin:    General: Skin is warm and dry.     Capillary Refill: Capillary refill takes less than 2 seconds.  Neurological:     General: No focal deficit present.     Mental Status: He is alert and oriented to person, place, and time.  Psychiatric:        Mood and Affect: Mood normal.        Behavior: Behavior normal.        Thought Content: Thought content normal.        Judgment: Judgment normal.        Assessment & Plan:  1. Routine general medical examination at a health care facility (Primary) Today patient counseled on age appropriate routine health concerns for screening and prevention, each reviewed and up to date or declined. Immunizations reviewed and up to date or declined. Labs ordered and reviewed. Risk factors for depression reviewed and negative. Hearing function and visual acuity are intact. ADLs screened and addressed as needed. Functional ability and level of safety reviewed and appropriate. Education, counseling and referrals performed based on assessed risks today. Patient provided with a copy of personalized plan for preventive services. - Follow up in one year or sooner if needed - Continue to eat healthy and exercise   2. Hypothyroidism, unspecified type - Consider change in synthroid  - Lipid panel; Future - TSH;  Future - CBC; Future - Comprehensive metabolic panel; Future  3. Essential hypertension - Will have him drop to 5 mg and see how his BP responds  - Lipid panel; Future - TSH; Future - CBC; Future - Comprehensive  metabolic panel; Future  4. Dyslipidemia - Consider increasing satin  - Lipid panel; Future - TSH; Future - CBC; Future - Comprehensive metabolic panel; Future  5. Benign prostatic hyperplasia without lower urinary tract symptoms  - PSA; Future  6. Glucose intolerance - Consider adding metformin  - Lipid panel; Future - TSH; Future - CBC; Future - Comprehensive metabolic panel; Future - Hemoglobin A1c; Future  7. Need for influenza vaccination  - Flu vaccine trivalent PF, 6mos and older(Flulaval,Afluria,Fluarix,Fluzone)  8. Low testosterone  - Testosterone Total,Free,Bio, Males; Future  Shirline Frees, NP

## 2023-11-21 NOTE — Patient Instructions (Signed)
It was great seeing you today   We will follow up with you regarding your lab work   Please let me know if you need anything   

## 2023-11-22 ENCOUNTER — Other Ambulatory Visit: Payer: Self-pay | Admitting: Adult Health

## 2023-11-22 DIAGNOSIS — E039 Hypothyroidism, unspecified: Secondary | ICD-10-CM

## 2023-11-22 LAB — TESTOSTERONE TOTAL,FREE,BIO, MALES
Albumin: 4.8 g/dL (ref 3.6–5.1)
Sex Hormone Binding: 44 nmol/L (ref 22–77)
Testosterone, Bioavailable: 129.3 ng/dL (ref 110.0–575.0)
Testosterone, Free: 59.1 pg/mL (ref 46.0–224.0)
Testosterone: 561 ng/dL (ref 250–827)

## 2023-11-22 MED ORDER — LEVOTHYROXINE SODIUM 75 MCG PO TABS: 75.0000 ug | ORAL_TABLET | Freq: Every day | ORAL | 3 refills | Status: DC

## 2024-02-21 DIAGNOSIS — R3911 Hesitancy of micturition: Secondary | ICD-10-CM | POA: Diagnosis not present

## 2024-02-21 DIAGNOSIS — R3912 Poor urinary stream: Secondary | ICD-10-CM | POA: Diagnosis not present

## 2024-06-16 DIAGNOSIS — U071 COVID-19: Secondary | ICD-10-CM | POA: Diagnosis not present

## 2024-06-16 DIAGNOSIS — R059 Cough, unspecified: Secondary | ICD-10-CM | POA: Diagnosis not present

## 2024-10-01 DIAGNOSIS — D2361 Other benign neoplasm of skin of right upper limb, including shoulder: Secondary | ICD-10-CM | POA: Diagnosis not present

## 2024-10-01 DIAGNOSIS — L821 Other seborrheic keratosis: Secondary | ICD-10-CM | POA: Diagnosis not present

## 2024-10-01 DIAGNOSIS — L814 Other melanin hyperpigmentation: Secondary | ICD-10-CM | POA: Diagnosis not present

## 2024-10-01 DIAGNOSIS — D1801 Hemangioma of skin and subcutaneous tissue: Secondary | ICD-10-CM | POA: Diagnosis not present

## 2024-10-12 ENCOUNTER — Other Ambulatory Visit: Payer: Self-pay | Admitting: Adult Health

## 2024-10-12 DIAGNOSIS — I10 Essential (primary) hypertension: Secondary | ICD-10-CM

## 2024-10-14 NOTE — Telephone Encounter (Signed)
 Patient need to schedule for more refills.

## 2024-11-02 ENCOUNTER — Other Ambulatory Visit: Payer: Self-pay | Admitting: Adult Health

## 2024-11-02 DIAGNOSIS — E785 Hyperlipidemia, unspecified: Secondary | ICD-10-CM

## 2024-11-10 DIAGNOSIS — S39012A Strain of muscle, fascia and tendon of lower back, initial encounter: Secondary | ICD-10-CM | POA: Diagnosis not present

## 2024-11-20 ENCOUNTER — Other Ambulatory Visit: Payer: Self-pay | Admitting: Adult Health

## 2024-11-20 DIAGNOSIS — I10 Essential (primary) hypertension: Secondary | ICD-10-CM

## 2024-12-07 ENCOUNTER — Other Ambulatory Visit: Payer: Self-pay | Admitting: Adult Health

## 2024-12-07 DIAGNOSIS — E039 Hypothyroidism, unspecified: Secondary | ICD-10-CM

## 2024-12-11 ENCOUNTER — Ambulatory Visit: Admitting: Adult Health

## 2024-12-11 ENCOUNTER — Encounter: Payer: Self-pay | Admitting: Adult Health

## 2024-12-11 ENCOUNTER — Ambulatory Visit: Payer: Self-pay | Admitting: Adult Health

## 2024-12-11 VITALS — BP 120/70 | HR 65 | Temp 98.4°F | Ht 70.0 in | Wt 238.0 lb

## 2024-12-11 DIAGNOSIS — Z0000 Encounter for general adult medical examination without abnormal findings: Secondary | ICD-10-CM | POA: Diagnosis not present

## 2024-12-11 DIAGNOSIS — E039 Hypothyroidism, unspecified: Secondary | ICD-10-CM

## 2024-12-11 DIAGNOSIS — N400 Enlarged prostate without lower urinary tract symptoms: Secondary | ICD-10-CM | POA: Diagnosis not present

## 2024-12-11 DIAGNOSIS — E66811 Obesity, class 1: Secondary | ICD-10-CM | POA: Diagnosis not present

## 2024-12-11 DIAGNOSIS — I10 Essential (primary) hypertension: Secondary | ICD-10-CM | POA: Diagnosis not present

## 2024-12-11 DIAGNOSIS — Z23 Encounter for immunization: Secondary | ICD-10-CM

## 2024-12-11 DIAGNOSIS — R7303 Prediabetes: Secondary | ICD-10-CM | POA: Diagnosis not present

## 2024-12-11 DIAGNOSIS — E785 Hyperlipidemia, unspecified: Secondary | ICD-10-CM

## 2024-12-11 DIAGNOSIS — Z6834 Body mass index (BMI) 34.0-34.9, adult: Secondary | ICD-10-CM

## 2024-12-11 LAB — LIPID PANEL
Cholesterol: 167 mg/dL (ref 28–200)
HDL: 68.6 mg/dL
LDL Cholesterol: 78 mg/dL (ref 10–99)
NonHDL: 98.18
Total CHOL/HDL Ratio: 2
Triglycerides: 101 mg/dL (ref 10.0–149.0)
VLDL: 20.2 mg/dL (ref 0.0–40.0)

## 2024-12-11 LAB — COMPREHENSIVE METABOLIC PANEL WITH GFR
ALT: 34 U/L (ref 3–53)
AST: 24 U/L (ref 5–37)
Albumin: 4.9 g/dL (ref 3.5–5.2)
Alkaline Phosphatase: 53 U/L (ref 39–117)
BUN: 17 mg/dL (ref 6–23)
CO2: 27 meq/L (ref 19–32)
Calcium: 9.7 mg/dL (ref 8.4–10.5)
Chloride: 100 meq/L (ref 96–112)
Creatinine, Ser: 0.98 mg/dL (ref 0.40–1.50)
GFR: 82.48 mL/min
Glucose, Bld: 113 mg/dL — ABNORMAL HIGH (ref 70–99)
Potassium: 4.5 meq/L (ref 3.5–5.1)
Sodium: 135 meq/L (ref 135–145)
Total Bilirubin: 2.3 mg/dL — ABNORMAL HIGH (ref 0.2–1.2)
Total Protein: 7.4 g/dL (ref 6.0–8.3)

## 2024-12-11 LAB — TSH: TSH: 3.62 u[IU]/mL (ref 0.35–5.50)

## 2024-12-11 LAB — CBC
HCT: 43.6 % (ref 39.0–52.0)
Hemoglobin: 14.9 g/dL (ref 13.0–17.0)
MCHC: 34.2 g/dL (ref 30.0–36.0)
MCV: 85.8 fl (ref 78.0–100.0)
Platelets: 250 K/uL (ref 150.0–400.0)
RBC: 5.08 Mil/uL (ref 4.22–5.81)
RDW: 12.9 % (ref 11.5–15.5)
WBC: 7.6 K/uL (ref 4.0–10.5)

## 2024-12-11 LAB — PSA: PSA: 1.46 ng/mL (ref 0.10–4.00)

## 2024-12-11 LAB — HEMOGLOBIN A1C: Hgb A1c MFr Bld: 6.1 % (ref 4.6–6.5)

## 2024-12-11 MED ORDER — LEVOTHYROXINE SODIUM 75 MCG PO TABS: 75.0000 ug | ORAL_TABLET | Freq: Every day | ORAL | 3 refills | Status: AC

## 2024-12-11 MED ORDER — LISINOPRIL 10 MG PO TABS: 10.0000 mg | ORAL_TABLET | Freq: Every day | ORAL | 3 refills | Status: AC

## 2024-12-11 NOTE — Patient Instructions (Signed)
 It was great seeing you today   We will follow up with you regarding your lab work   Please let me know if you need anything

## 2024-12-11 NOTE — Progress Notes (Signed)
 "  Subjective:    Patient ID: William Rosales, male    DOB: 12/29/1960, 64 y.o.   MRN: [id number]  HPI Patient presents for yearly preventative medicine examination. He is a pleasant 64 year old male who  has a past medical history of ADD (attention deficit disorder with hyperactivity), Dyslipidemia, Hyperlipidemia, Hypertension, and Hypothyroidism.  Hypertension-managed with lisinopril  10 mg daily. He does check his BP at home and routinely has readings in the 110 systolic range.  BP Readings from Last 3 Encounters:  12/11/24 120/70  11/21/23 110/70  11/02/22 110/80   Dyslipidemia-managed with Lipitor 10 mg daily.  He is monitored by cardiology yearly due to family history of cardiac disease.  He denies myalgia or fatigue with Lipitor Lab Results  Component Value Date   CHOL 167 11/21/2023   HDL 72.30 11/21/2023   LDLCALC 85 11/21/2023   LDLDIRECT 134.9 08/25/2009   TRIG 49.0 11/21/2023   CHOLHDL 2 11/21/2023    Hypothyroidism-managed with Synthroid  75 mcg daily.  He does feel well controlled on this dose Lab Results  Component Value Date   TSH 5.18 11/21/2023   BPH-symptoms controlled with Flomax  0.4 mg and Proscar 5 mg daily. He is managed by urology.   Prediabetes - has refused medication in the past and opted for lifestyle modifications  Lab Results  Component Value Date   HGBA1C 5.8 11/21/2023   HGBA1C 5.7 11/02/2022   HGBA1C 6.3 10/26/2021    All immunizations and health maintenance protocols were reviewed with the patient and needed orders were placed.  Appropriate screening laboratory values were ordered for the patient including screening of hyperlipidemia, renal function and hepatic function. If indicated by BPH, a PSA was ordered.  Medication reconciliation,  past medical history, social history, problem list and allergies were reviewed in detail with the patient  Goals were established with regard to weight loss, exercise, and  diet in compliance with  medications. He is still eating healthy but has not been exercising and his weight has increased by 30 pounds. He is not drinking alcohol. Wt Readings from Last 3 Encounters:  12/11/24 238 lb (108 kg)  11/21/23 208 lb (94.3 kg)  11/02/22 202 lb (91.6 kg)   He is up to date on routine colon cancer screening   Review of Systems  Constitutional: Negative.   HENT: Negative.    Eyes: Negative.   Respiratory: Negative.    Cardiovascular: Negative.   Gastrointestinal: Negative.   Endocrine: Negative.   Genitourinary: Negative.   Musculoskeletal: Negative.   Skin: Negative.   Allergic/Immunologic: Negative.   Neurological: Negative.   Hematological: Negative.   Psychiatric/Behavioral: Negative.    All other systems reviewed and are negative.  Past Medical History:  Diagnosis Date   ADD (attention deficit disorder with hyperactivity)    Dyslipidemia    Hyperlipidemia    on meds   Hypertension    on meds   Hypothyroidism    on meds    Social History   Socioeconomic History   Marital status: Married    Spouse name: Not on file   Number of children: Not on file   Years of education: Not on file   Highest education level: Associate degree: occupational, scientist, product/process development, or vocational program  Occupational History   Occupation: pensions consultant  Tobacco Use   Smoking status: Former    Current packs/day: 0.00    Average packs/day: 1 pack/day for 18.0 years (18.0 ttl pk-yrs)    Types: Cigarettes  Start date: 02/20/1978    Quit date: 02/21/1996    Years since quitting: 28.8   Smokeless tobacco: Never  Vaping Use   Vaping status: Never Used  Substance and Sexual Activity   Alcohol use: Yes    Alcohol/week: 7.0 standard drinks of alcohol    Types: 7 Standard drinks or equivalent per week   Drug use: No   Sexual activity: Not on file  Other Topics Concern   Not on file  Social History Narrative   Not on file   Social Drivers of Health   Tobacco Use: Medium Risk  (12/11/2024)   Patient History    Smoking Tobacco Use: Former    Smokeless Tobacco Use: Never    Passive Exposure: Not on file  Financial Resource Strain: Low Risk (11/20/2023)   Overall Financial Resource Strain (CARDIA)    Difficulty of Paying Living Expenses: Not hard at all  Food Insecurity: No Food Insecurity (11/20/2023)   Hunger Vital Sign    Worried About Running Out of Food in the Last Year: Never true    Ran Out of Food in the Last Year: Never true  Transportation Needs: No Transportation Needs (11/20/2023)   PRAPARE - Administrator, Civil Service (Medical): No    Lack of Transportation (Non-Medical): No  Physical Activity: Sufficiently Active (11/20/2023)   Exercise Vital Sign    Days of Exercise per Week: 5 days    Minutes of Exercise per Session: 60 min  Stress: No Stress Concern Present (11/20/2023)   [employer] of Occupational Health - Occupational Stress Questionnaire    Feeling of Stress : Not at all  Social Connections: Moderately Isolated (11/20/2023)   Social Connection and Isolation Panel    Frequency of Communication with Friends and Family: Twice a week    Frequency of Social Gatherings with Friends and Family: More than three times a week    Attends Religious Services: Never    Database Administrator or Organizations: No    Attends Engineer, Structural: Not on file    Marital Status: Married  Catering Manager Violence: Not on file  Depression (PHQ2-9): Low Risk (12/11/2024)   Depression (PHQ2-9)    PHQ-2 Score: 0  Alcohol Screen: Low Risk (11/20/2023)   Alcohol Screen    Last Alcohol Screening Score (AUDIT): 3  Housing: Low Risk (11/20/2023)   Housing Stability Vital Sign    Unable to Pay for Housing in the Last Year: No    Number of Times Moved in the Last Year: 0    Homeless in the Last Year: No  Utilities: Not on file  Health Literacy: Not on file    Past Surgical History:  Procedure Laterality Date   COLONOSCOPY   2010   CG-F/V-movi(exc)-normal-hems   PRE-MALIGNANT / BENIGN SKIN LESION EXCISION     abdomen    WISDOM TOOTH EXTRACTION      Family History  Problem Relation Age of Onset   Colon polyps Father 38   Stroke Father 40   Non-Hodgkin's lymphoma Father 44   Lung cancer Maternal Uncle 45   Hypertension Other    Colon cancer Neg Hx    Esophageal cancer Neg Hx    Stomach cancer Neg Hx    Rectal cancer Neg Hx     Allergies[1]  Medications Ordered Prior to Encounter[2]  BP 120/70   Pulse 65   Temp 98.4 F (36.9 C) (Oral)   Ht 5' 10 (1.778 m)  Wt 238 lb (108 kg)   SpO2 97%   BMI 34.15 kg/m       Objective:   Physical Exam Vitals and nursing note reviewed.  Constitutional:      General: He is not in acute distress.    Appearance: Normal appearance. He is obese. He is not ill-appearing.  HENT:     Head: Normocephalic and atraumatic.     Right Ear: Tympanic membrane, ear canal and external ear normal. There is no impacted cerumen.     Left Ear: Tympanic membrane, ear canal and external ear normal. There is no impacted cerumen.     Nose: Nose normal. No congestion or rhinorrhea.     Mouth/Throat:     Mouth: Mucous membranes are moist.     Pharynx: Oropharynx is clear.  Eyes:     Extraocular Movements: Extraocular movements intact.     Conjunctiva/sclera: Conjunctivae normal.     Pupils: Pupils are equal, round, and reactive to light.  Neck:     Vascular: No carotid bruit.  Cardiovascular:     Rate and Rhythm: Normal rate and regular rhythm.     Pulses: Normal pulses.     Heart sounds: No murmur heard.    No friction rub. No gallop.  Pulmonary:     Effort: Pulmonary effort is normal.     Breath sounds: Normal breath sounds.  Abdominal:     General: Abdomen is flat. Bowel sounds are normal. There is no distension.     Palpations: Abdomen is soft. There is no mass.     Tenderness: There is no abdominal tenderness. There is no guarding or rebound.     Hernia: No  hernia is present.  Musculoskeletal:        General: Normal range of motion.     Cervical back: Normal range of motion and neck supple.  Lymphadenopathy:     Cervical: No cervical adenopathy.  Skin:    General: Skin is warm and dry.     Capillary Refill: Capillary refill takes less than 2 seconds.  Neurological:     General: No focal deficit present.     Mental Status: He is alert and oriented to person, place, and time.  Psychiatric:        Mood and Affect: Mood normal.        Behavior: Behavior normal.        Thought Content: Thought content normal.        Judgment: Judgment normal.        Assessment & Plan:  1. Routine general medical examination at a health care facility (Primary) Today patient counseled on age appropriate routine health concerns for screening and prevention, each reviewed and up to date or declined. Immunizations reviewed and up to date or declined. Labs ordered and reviewed. Risk factors for depression reviewed and negative. Hearing function and visual acuity are intact. ADLs screened and addressed as needed. Functional ability and level of safety reviewed and appropriate. Education, counseling and referrals performed based on assessed risks today. Patient provided with a copy of personalized plan for preventive services.   2. Essential hypertension - Well controlled. No change in medication - Lipid panel; Future - TSH; Future - CBC; Future - Comprehensive metabolic panel with GFR; Future  3. Dyslipidemia - Consider increase in statin  - Lipid panel; Future - TSH; Future - CBC; Future - Comprehensive metabolic panel with GFR; Future  4. Hypothyroidism, unspecified type - Consider dose change of synthroid   -  Lipid panel; Future - TSH; Future - CBC; Future - Comprehensive metabolic panel with GFR; Future  5. Benign prostatic hyperplasia without lower urinary tract symptoms - Per urology  - PSA; Future  6. Prediabetes - Consider Metformin  -  Lipid panel; Future - TSH; Future - CBC; Future - Comprehensive metabolic panel with GFR; Future - Hemoglobin A1c; Future  7. Need for pneumococcal vaccine  - Pneumococcal conjugate vaccine 20-valent (Prevnar 20)  8. Need for influenza vaccination  - Flu vaccine trivalent PF, 6mos and older(Flulaval,Afluria,Fluarix,Fluzone)  9. Class 1 obesity - He is ready to begin his weight loss journey again. He plans to get back into exercise but would like help. We will refer him to [facility].  - Lipid panel; Future - TSH; Future - CBC; Future - Comprehensive metabolic panel with GFR; Future - Hemoglobin A1c; Future - Amb Ref to Medical Weight Management  10. BMI 34.0-34.9,adult  - Lipid panel; Future - TSH; Future - CBC; Future - Comprehensive metabolic panel with GFR; Future - Hemoglobin A1c; Future  Darleene Shape, NP     [1] No Known Allergies [2]  Current Outpatient Medications on File Prior to Visit  Medication Sig Dispense Refill   atorvastatin  (LIPITOR) 10 MG tablet TAKE 1 TABLET BY MOUTH EVERY DAY 90 tablet 3   Coenzyme Q10 100 MG capsule Take 100 mg by mouth daily.     finasteride (PROSCAR) 5 MG tablet      levothyroxine  (SYNTHROID ) 75 MCG tablet TAKE 1 TABLET BY MOUTH EVERY DAY 90 tablet 3   lisinopril  (ZESTRIL ) 10 MG tablet TAKE 1 TABLET BY MOUTH EVERY DAY 30 tablet 0   tamsulosin  (FLOMAX ) 0.4 MG CAPS capsule Take 1 capsule (0.4 mg total) by mouth daily. 90 capsule 3   traMADol (ULTRAM) 50 MG tablet Take 50 mg by mouth 4 (four) times daily as needed.     No current facility-administered medications on file prior to visit.   "

## 2025-01-21 ENCOUNTER — Institutional Professional Consult (permissible substitution) (INDEPENDENT_AMBULATORY_CARE_PROVIDER_SITE_OTHER): Admitting: Physician Assistant
